# Patient Record
Sex: Male | Born: 1945 | Race: White | Hispanic: No | Marital: Married | Smoking: Former smoker
Health system: Southern US, Community
[De-identification: ages and names within clinical notes are randomized; demographics above are authoritative.]

## PROBLEM LIST (undated history)

## (undated) DIAGNOSIS — I10 Essential (primary) hypertension: Secondary | ICD-10-CM

## (undated) DIAGNOSIS — N289 Disorder of kidney and ureter, unspecified: Secondary | ICD-10-CM

## (undated) HISTORY — PX: EYE SURGERY: SHX253

## (undated) HISTORY — PX: BLADDER REMOVAL: SHX567

## (undated) HISTORY — PX: KIDNEY STONE SURGERY: SHX686

---

## 2011-01-10 ENCOUNTER — Encounter: Payer: Self-pay | Admitting: *Deleted

## 2011-01-10 ENCOUNTER — Emergency Department (HOSPITAL_COMMUNITY)
Admission: EM | Admit: 2011-01-10 | Discharge: 2011-01-10 | Disposition: A | Discharge status: Home / Self Care | Payer: Medicare Other | Attending: Emergency Medicine | Admitting: Emergency Medicine

## 2011-01-10 DIAGNOSIS — E875 Hyperkalemia: Secondary | ICD-10-CM

## 2011-01-10 DIAGNOSIS — Z79899 Other long term (current) drug therapy: Secondary | ICD-10-CM | POA: Insufficient documentation

## 2011-01-10 DIAGNOSIS — N289 Disorder of kidney and ureter, unspecified: Secondary | ICD-10-CM

## 2011-01-10 DIAGNOSIS — Z87891 Personal history of nicotine dependence: Secondary | ICD-10-CM | POA: Insufficient documentation

## 2011-01-10 HISTORY — DX: Disorder of kidney and ureter, unspecified: N28.9

## 2011-01-10 HISTORY — DX: Essential (primary) hypertension: I10

## 2011-01-10 LAB — BASIC METABOLIC PANEL
Chloride: 107 mEq/L (ref 96–112)
GFR calc non Af Amer: 23 mL/min — ABNORMAL LOW (ref >90)
Glucose, Bld: 93 mg/dL (ref 70–99)
Potassium: 5.3 mEq/L — ABNORMAL HIGH (ref 3.5–5.1)
Sodium: 135 mEq/L (ref 135–145)

## 2011-01-10 LAB — CBC
Hemoglobin: 12.9 g/dL — ABNORMAL LOW (ref 13.0–17.0)
MCH: 28.8 pg (ref 26.0–34.0)
RBC: 4.48 MIL/uL (ref 4.22–5.81)

## 2011-01-10 MED ORDER — SODIUM POLYSTYRENE SULFONATE 15 GM/60ML PO SUSP
30.0000 g | Freq: Once | ORAL | Status: AC
  Administered 2011-01-10: 30 g via ORAL
  Filled 2011-01-10: qty 120

## 2011-01-10 NOTE — ED Notes (Signed)
Pt states he had his blood drawn yesterday and was called today at 0630 and told his K+ was high and he needed to get to the ED.

## 2011-01-10 NOTE — ED Provider Notes (Signed)
History     CSN: [id number] Arrival date & time: 01/10/2011 10:10 AM  Chief Complaint  Patient presents with  . Illegal value: [    abnormal labs    (Consider location/radiation/quality/duration/timing/severity/associated sxs/prior treatment) The history is provided by the patient.  patient states has hx renal insufficiency, had routine labs done yesterday and was told k high. Pt denies any specific c/o. No weakness. No sob. No nv. States only recent new med was lisinopril. Was told of K being high just today. No exacerbating or allev factors.   Past Medical History  Diagnosis Date  . Renal disorder   . Hypertension     Past Surgical History  Procedure Date  . Bladder removal   . Eye surgery   . Kidney stone surgery     History reviewed. No pertinent family history.  History  Substance Use Topics  . Smoking status: Former Research scientist (life sciences)  . Smokeless tobacco: Not on file  . Alcohol Use: Yes     occasionally      Review of Systems  Constitutional: Negative for fever.  HENT: Negative for neck pain.   Eyes: Negative for pain.  Respiratory: Negative for shortness of breath.   Cardiovascular: Negative for chest pain.  Gastrointestinal: Negative for abdominal pain.  Musculoskeletal: Negative for back pain.  Skin: Negative for rash.  Neurological: Negative for headaches.  Hematological: Does not bruise/bleed easily.  Psychiatric/Behavioral: Negative for behavioral problems.    Allergies  Doxycycline; Penicillins; and Metronidazole  Home Medications   Current Outpatient Rx  Name Route Sig Dispense Refill  . DOCUSATE SODIUM 100 MG PO CAPS Oral Take 100 mg by mouth 2 (two) times daily.      Marland Kitchen HYDROCODONE-ACETAMINOPHEN 5-500 MG PO CAPS Oral Take 1 capsule by mouth every 6 (six) hours as needed. For pain     . OXYCODONE HCL 5 MG PO CAPS Oral Take 5 mg by mouth every 4 (four) hours as needed. For pain     . POLYETHYLENE GLYCOL 3350 PO PACK Oral Take 17 g by mouth daily as  needed. constipation      . SORBITOL 70 % PO SOLN Oral Take 15 mLs by mouth daily as needed. constipation     . SULFAMETHOXAZOLE-TRIMETHOPRIM 400-80 MG PO TABS Oral Take 1 tablet by mouth daily.        BP 152/82  Pulse 74  Temp(Src) 97.6 F (36.4 C) (Oral)  Resp 16  Ht 5\' 9"  (1.753 m)  Wt 185 lb (83.915 kg)  BMI 27.32 kg/m2  SpO2 100%  Physical Exam  Nursing note and vitals reviewed. Constitutional: He is oriented to person, place, and time. He appears well-developed and well-nourished. No distress.  HENT:  Head: Atraumatic.  Eyes: No scleral icterus.  Neck: Neck supple. No tracheal deviation present.  Cardiovascular: Normal rate and normal heart sounds.   Pulmonary/Chest: Effort normal and breath sounds normal. No accessory muscle usage. No respiratory distress.  Abdominal: He exhibits no distension. There is no tenderness.  Musculoskeletal: Normal range of motion. He exhibits no edema.  Neurological: He is alert and oriented to person, place, and time.  Skin: Skin is warm and dry.  Psychiatric: He has a normal mood and affect.    ED Course  Procedures (including critical care time)  Labs Reviewed  BASIC METABOLIC PANEL - Abnormal; Notable for the following:    Potassium 5.3 (*)    CO2 17 (*)    BUN 37 (*)    Creatinine, Ser 2.72 (*)  GFR calc non Af Amer 23 (*)    GFR calc Af Amer 27 (*)    All other components within normal limits  CBC - Abnormal; Notable for the following:    Hemoglobin 12.9 (*)    RDW 16.0 (*)    All other components within normal limits   No results found.   1. Renal insufficiency   2. Hyperkalemia       MDM  Labs sent.     k sl high. Kayexalate po. Will have f/u pcp 2 days for recheck, and repeat k, will have hold lisinopril until pcp f/u.     Mirna Mires, MD 01/10/11 1136

## 2012-05-06 DIAGNOSIS — N2581 Secondary hyperparathyroidism of renal origin: Secondary | ICD-10-CM | POA: Insufficient documentation

## 2015-07-01 ENCOUNTER — Other Ambulatory Visit (HOSPITAL_COMMUNITY): Payer: Self-pay | Admitting: Family Medicine

## 2015-07-01 ENCOUNTER — Ambulatory Visit (HOSPITAL_COMMUNITY)
Admission: RE | Admit: 2015-07-01 | Discharge: 2015-07-01 | Disposition: A | Discharge status: Home / Self Care | Payer: Medicare (Managed Care) | Source: Ambulatory Visit | Attending: Family Medicine | Admitting: Family Medicine

## 2015-07-01 DIAGNOSIS — Z87891 Personal history of nicotine dependence: Secondary | ICD-10-CM | POA: Diagnosis not present

## 2015-07-01 DIAGNOSIS — R0602 Shortness of breath: Secondary | ICD-10-CM

## 2015-07-01 DIAGNOSIS — J189 Pneumonia, unspecified organism: Secondary | ICD-10-CM | POA: Insufficient documentation

## 2015-08-05 ENCOUNTER — Ambulatory Visit (HOSPITAL_COMMUNITY)
Admission: RE | Admit: 2015-08-05 | Discharge: 2015-08-05 | Disposition: A | Discharge status: Home / Self Care | Payer: Medicare Other | Source: Ambulatory Visit | Attending: Family Medicine | Admitting: Family Medicine

## 2015-08-05 ENCOUNTER — Other Ambulatory Visit (HOSPITAL_COMMUNITY): Payer: Self-pay | Admitting: Family Medicine

## 2015-08-05 DIAGNOSIS — J189 Pneumonia, unspecified organism: Secondary | ICD-10-CM | POA: Insufficient documentation

## 2015-08-05 DIAGNOSIS — R918 Other nonspecific abnormal finding of lung field: Secondary | ICD-10-CM

## 2015-11-23 ENCOUNTER — Other Ambulatory Visit (HOSPITAL_COMMUNITY): Payer: Self-pay | Admitting: Family Medicine

## 2015-11-23 ENCOUNTER — Ambulatory Visit (HOSPITAL_COMMUNITY)
Admission: RE | Admit: 2015-11-23 | Discharge: 2015-11-23 | Disposition: A | Discharge status: Home / Self Care | Payer: Medicare Other | Source: Ambulatory Visit | Attending: Family Medicine | Admitting: Family Medicine

## 2015-11-23 DIAGNOSIS — J189 Pneumonia, unspecified organism: Secondary | ICD-10-CM

## 2015-11-23 DIAGNOSIS — J181 Lobar pneumonia, unspecified organism: Secondary | ICD-10-CM

## 2015-11-23 DIAGNOSIS — Z8701 Personal history of pneumonia (recurrent): Secondary | ICD-10-CM | POA: Insufficient documentation

## 2015-11-23 DIAGNOSIS — R918 Other nonspecific abnormal finding of lung field: Secondary | ICD-10-CM | POA: Insufficient documentation

## 2015-11-23 DIAGNOSIS — Z09 Encounter for follow-up examination after completed treatment for conditions other than malignant neoplasm: Secondary | ICD-10-CM | POA: Insufficient documentation

## 2016-12-05 ENCOUNTER — Other Ambulatory Visit (HOSPITAL_COMMUNITY): Payer: Self-pay | Admitting: Family Medicine

## 2016-12-05 ENCOUNTER — Ambulatory Visit (HOSPITAL_COMMUNITY)
Admission: RE | Admit: 2016-12-05 | Discharge: 2016-12-05 | Disposition: A | Discharge status: Home / Self Care | Payer: Medicare Other | Source: Ambulatory Visit | Attending: Family Medicine | Admitting: Family Medicine

## 2016-12-05 DIAGNOSIS — J984 Other disorders of lung: Secondary | ICD-10-CM | POA: Insufficient documentation

## 2016-12-05 DIAGNOSIS — R0602 Shortness of breath: Secondary | ICD-10-CM

## 2016-12-05 DIAGNOSIS — I700 Atherosclerosis of aorta: Secondary | ICD-10-CM | POA: Diagnosis not present

## 2016-12-05 DIAGNOSIS — Z87828 Personal history of other (healed) physical injury and trauma: Secondary | ICD-10-CM | POA: Diagnosis not present

## 2016-12-08 ENCOUNTER — Other Ambulatory Visit (HOSPITAL_COMMUNITY): Payer: Self-pay | Admitting: Family Medicine

## 2016-12-08 ENCOUNTER — Ambulatory Visit (HOSPITAL_COMMUNITY)
Admission: RE | Admit: 2016-12-08 | Discharge: 2016-12-08 | Disposition: A | Discharge status: Home / Self Care | Payer: Medicare Other | Source: Ambulatory Visit | Attending: Family Medicine | Admitting: Family Medicine

## 2016-12-08 ENCOUNTER — Encounter (HOSPITAL_COMMUNITY)
Admission: RE | Admit: 2016-12-08 | Discharge: 2016-12-08 | Disposition: A | Discharge status: Home / Self Care | Payer: Medicare Other | Source: Ambulatory Visit | Attending: Family Medicine | Admitting: Family Medicine

## 2016-12-08 ENCOUNTER — Encounter (HOSPITAL_COMMUNITY): Payer: Self-pay

## 2016-12-08 DIAGNOSIS — J9811 Atelectasis: Secondary | ICD-10-CM | POA: Diagnosis not present

## 2016-12-08 DIAGNOSIS — R0602 Shortness of breath: Secondary | ICD-10-CM

## 2016-12-08 DIAGNOSIS — R918 Other nonspecific abnormal finding of lung field: Secondary | ICD-10-CM | POA: Insufficient documentation

## 2016-12-08 MED ORDER — TECHNETIUM TC 99M DIETHYLENETRIAME-PENTAACETIC ACID
36.0000 | Freq: Once | INTRAVENOUS | Status: AC | PRN
  Administered 2016-12-08: 36 via INTRAVENOUS

## 2016-12-08 MED ORDER — TECHNETIUM TO 99M ALBUMIN AGGREGATED
3.0000 | Freq: Once | INTRAVENOUS | Status: AC | PRN
  Administered 2016-12-08: 4.6 via INTRAVENOUS

## 2017-03-06 DIAGNOSIS — C679 Malignant neoplasm of bladder, unspecified: Secondary | ICD-10-CM | POA: Insufficient documentation

## 2017-03-06 DIAGNOSIS — K219 Gastro-esophageal reflux disease without esophagitis: Secondary | ICD-10-CM | POA: Insufficient documentation

## 2017-03-06 DIAGNOSIS — I10 Essential (primary) hypertension: Secondary | ICD-10-CM | POA: Insufficient documentation

## 2017-03-06 DIAGNOSIS — N186 End stage renal disease: Secondary | ICD-10-CM | POA: Insufficient documentation

## 2017-05-07 ENCOUNTER — Other Ambulatory Visit (HOSPITAL_COMMUNITY): Payer: Self-pay | Admitting: Nephrology

## 2017-05-07 DIAGNOSIS — N184 Chronic kidney disease, stage 4 (severe): Secondary | ICD-10-CM

## 2017-05-22 ENCOUNTER — Ambulatory Visit (HOSPITAL_COMMUNITY)
Admission: RE | Admit: 2017-05-22 | Discharge: 2017-05-22 | Disposition: A | Discharge status: Home / Self Care | Payer: Medicare Other | Source: Ambulatory Visit | Attending: Nephrology | Admitting: Nephrology

## 2017-05-22 DIAGNOSIS — N184 Chronic kidney disease, stage 4 (severe): Secondary | ICD-10-CM | POA: Diagnosis not present

## 2017-10-03 ENCOUNTER — Emergency Department (HOSPITAL_COMMUNITY): Payer: Medicare Other

## 2017-10-03 ENCOUNTER — Emergency Department (HOSPITAL_COMMUNITY)
Admission: EM | Admit: 2017-10-03 | Discharge: 2017-10-03 | Disposition: A | Discharge status: Home / Self Care | Payer: Medicare Other | Attending: Emergency Medicine | Admitting: Emergency Medicine

## 2017-10-03 ENCOUNTER — Encounter (HOSPITAL_COMMUNITY): Payer: Self-pay

## 2017-10-03 DIAGNOSIS — R531 Weakness: Secondary | ICD-10-CM | POA: Insufficient documentation

## 2017-10-03 DIAGNOSIS — E860 Dehydration: Secondary | ICD-10-CM | POA: Insufficient documentation

## 2017-10-03 DIAGNOSIS — Z8551 Personal history of malignant neoplasm of bladder: Secondary | ICD-10-CM | POA: Diagnosis not present

## 2017-10-03 DIAGNOSIS — Z79899 Other long term (current) drug therapy: Secondary | ICD-10-CM | POA: Insufficient documentation

## 2017-10-03 DIAGNOSIS — Z87891 Personal history of nicotine dependence: Secondary | ICD-10-CM | POA: Diagnosis not present

## 2017-10-03 DIAGNOSIS — N390 Urinary tract infection, site not specified: Secondary | ICD-10-CM | POA: Diagnosis not present

## 2017-10-03 DIAGNOSIS — Z932 Ileostomy status: Secondary | ICD-10-CM | POA: Diagnosis not present

## 2017-10-03 DIAGNOSIS — R5383 Other fatigue: Secondary | ICD-10-CM | POA: Diagnosis not present

## 2017-10-03 DIAGNOSIS — I120 Hypertensive chronic kidney disease with stage 5 chronic kidney disease or end stage renal disease: Secondary | ICD-10-CM | POA: Insufficient documentation

## 2017-10-03 DIAGNOSIS — N185 Chronic kidney disease, stage 5: Secondary | ICD-10-CM | POA: Insufficient documentation

## 2017-10-03 LAB — COMPREHENSIVE METABOLIC PANEL
ALK PHOS: 94 U/L (ref 38–126)
ALT: 24 U/L (ref 0–44)
ANION GAP: 8 (ref 5–15)
AST: 20 U/L (ref 15–41)
Albumin: 3.8 g/dL (ref 3.5–5.0)
BUN: 71 mg/dL — ABNORMAL HIGH (ref 8–23)
CALCIUM: 9.1 mg/dL (ref 8.9–10.3)
CO2: 14 mmol/L — AB (ref 22–32)
Chloride: 116 mmol/L — ABNORMAL HIGH (ref 98–111)
Creatinine, Ser: 4.14 mg/dL — ABNORMAL HIGH (ref 0.61–1.24)
GFR calc non Af Amer: 13 mL/min — ABNORMAL LOW (ref >60)
GFR, EST AFRICAN AMERICAN: 15 mL/min — AB (ref >60)
Glucose, Bld: 115 mg/dL — ABNORMAL HIGH (ref 70–99)
Potassium: 4 mmol/L (ref 3.5–5.1)
SODIUM: 138 mmol/L (ref 135–145)
Total Bilirubin: 0.8 mg/dL (ref 0.3–1.2)
Total Protein: 8.1 g/dL (ref 6.5–8.1)

## 2017-10-03 LAB — CBC WITH DIFFERENTIAL/PLATELET
BASOS ABS: 0 10*3/uL (ref 0.0–0.1)
BASOS PCT: 0 %
Eosinophils Absolute: 0.1 10*3/uL (ref 0.0–0.7)
Eosinophils Relative: 1 %
HEMATOCRIT: 52.3 % — AB (ref 39.0–52.0)
HEMOGLOBIN: 17.5 g/dL — AB (ref 13.0–17.0)
Lymphocytes Relative: 11 %
Lymphs Abs: 1 10*3/uL (ref 0.7–4.0)
MCH: 30.3 pg (ref 26.0–34.0)
MCHC: 33.5 g/dL (ref 30.0–36.0)
MCV: 90.5 fL (ref 78.0–100.0)
MONOS PCT: 10 %
Monocytes Absolute: 0.9 10*3/uL (ref 0.1–1.0)
NEUTROS ABS: 6.8 10*3/uL (ref 1.7–7.7)
NEUTROS PCT: 78 %
Platelets: 187 10*3/uL (ref 150–400)
RBC: 5.78 MIL/uL (ref 4.22–5.81)
RDW: 15.5 % (ref 11.5–15.5)
WBC: 8.8 10*3/uL (ref 4.0–10.5)

## 2017-10-03 LAB — URINALYSIS, ROUTINE W REFLEX MICROSCOPIC
BILIRUBIN URINE: NEGATIVE
Glucose, UA: NEGATIVE mg/dL
Ketones, ur: NEGATIVE mg/dL
NITRITE: NEGATIVE
PH: 6 (ref 5.0–8.0)
Protein, ur: 30 mg/dL — AB
SPECIFIC GRAVITY, URINE: 1.008 (ref 1.005–1.030)
WBC, UA: >50 WBC/hpf — ABNORMAL HIGH (ref 0–5)

## 2017-10-03 LAB — TROPONIN I

## 2017-10-03 LAB — I-STAT TROPONIN, ED: Troponin i, poc: 0 ng/mL (ref 0.00–0.08)

## 2017-10-03 MED ORDER — SODIUM CHLORIDE 0.9 % IV BOLUS
1000.0000 mL | Freq: Once | INTRAVENOUS | Status: AC
  Administered 2017-10-03: 1000 mL via INTRAVENOUS

## 2017-10-03 MED ORDER — CIPROFLOXACIN HCL 500 MG PO TABS: 500.0000 mg | ORAL_TABLET | Freq: Two times a day (BID) | ORAL | 0 refills | Status: DC

## 2017-10-03 MED ORDER — CIPROFLOXACIN HCL 250 MG PO TABS
500.0000 mg | ORAL_TABLET | Freq: Once | ORAL | Status: AC
  Administered 2017-10-03: 500 mg via ORAL
  Filled 2017-10-03: qty 2

## 2017-10-03 NOTE — ED Notes (Signed)
Repeat EKG done at 10:52

## 2017-10-03 NOTE — ED Triage Notes (Signed)
Pt states change of diabetic medication 2 weeks ago, has not been compliant with prescribed medication. States generalized weakness, emesis, and constipation.

## 2017-10-03 NOTE — ED Notes (Signed)
Advised patient we needed urine specimen. 

## 2017-10-03 NOTE — ED Provider Notes (Signed)
[facility] EMERGENCY DEPARTMENT Provider Note   CSN: [id number] Arrival date & time: 10/03/17  1015     History   Chief Complaint Chief Complaint  Patient presents with  . Weakness    HPI Shane Reeves is a 72 y.o. male.  Pt presents to the ED today with weakness.  The pt is very vague.  He said he feels terrible.  His wife said he has not been eating or drinking much.  The pt does have a hx of dm and was rx'd a different insulin, but that made him feel worse, so he quit taking it.  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  Pt gets frequent ureter infections.  He feels like he has one again.  He has seen pus come out into his bag. 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  He denies f/c.     Past Medical History:  Diagnosis Date  . Hypertension   . Renal disorder     There are no active problems to display for this patient.   Past Surgical History:  Procedure Laterality Date  . BLADDER REMOVAL    . EYE SURGERY    . KIDNEY STONE SURGERY          Home Medications    Prior to Admission medications   Medication Sig Start Date End Date Taking? Authorizing Provider  albuterol (ACCUNEB) 1.25 MG/3ML nebulizer solution Take 1 ampule by nebulization every 6 (six) hours as needed for wheezing.   Yes [provider]  albuterol (PROVENTIL HFA;VENTOLIN HFA) 108 (90 Base) MCG/ACT inhaler Inhale 1-2 puffs into the lungs every 6 (six) hours as needed for wheezing or shortness of breath.   Yes [provider]  BYDUREON BCISE 2 MG/0.85ML AUIJ Inject 2 mg into the skin once a week. 09/10/17  Yes [provider]  Cholecalciferol (VITAMIN D3) 1000 units CAPS Take 1 capsule by mouth daily.   Yes [provider]  docusate sodium (COLACE) 100 MG capsule Take 100 mg by mouth 2 (two) times daily.     Yes [provider]  ferrous sulfate 325 (65 FE) MG tablet Take 1  tablet by mouth 2 (two) times daily.   Yes [provider]  furosemide (LASIX) 20 MG tablet Take 20 mg by mouth daily.  08/13/17  Yes [provider]  hydrocortisone (ANUSOL-HC) 25 MG suppository Place 1 suppository rectally 3 (three) times daily as needed. 09/08/17  Yes [provider]  mometasone (NASONEX) 50 MCG/ACT nasal spray Place 2 sprays into the nose daily.   Yes [provider]  naproxen (NAPROSYN) 500 MG tablet Take 500 mg by mouth 2 (two) times daily. 09/04/17  Yes [provider]  omeprazole (PRILOSEC) 20 MG capsule Take 20 mg by mouth daily. 06/28/17  Yes [provider]  oxycodone (OXY-IR) 5 MG capsule Take 5 mg by mouth every 4 (four) hours as needed. For pain    Yes [provider]  polyethylene glycol (MIRALAX / GLYCOLAX) packet Take 17 g by mouth daily as needed. constipation     Yes [provider]  pravastatin (PRAVACHOL) 40 MG tablet Take 40 mg by mouth daily. 07/19/17  Yes [provider]  propranolol (INDERAL) 40 MG tablet Take 40 mg by mouth daily. 06/28/17  Yes [provider]  sodium bicarbonate 325 MG tablet Take 1 tablet by mouth 2 (  two) times daily.   Yes [provider]  sorbitol 70 % solution Take 15 mLs by mouth daily as needed. constipation    Yes [provider]  ciprofloxacin (CIPRO) 500 MG tablet Take 1 tablet (500 mg total) by mouth 2 (two) times daily. 10/03/17   Isla Pence, MD  JARDIANCE 25 MG TABS tablet Take 25 mg by mouth daily. 08/28/17   [provider]    Family History History reviewed. No pertinent family history.  Social History Social History   Tobacco Use  . Smoking status: Former Smoker  Substance Use Topics  . Alcohol use: Yes    Comment: occasionally  . Drug use: No     Allergies   Doxycycline; Penicillins; and Metronidazole   Review of Systems Review of Systems  Constitutional: Positive for appetite change and  fatigue.  Neurological: Positive for weakness.  All other systems reviewed and are negative.    Physical Exam Updated Vital Signs BP 115/72   Pulse 65   Temp 98.2 F (36.8 C) (Oral)   Resp 16   Ht 5\' 11"  (1.803 m)   Wt 79.4 kg (175 lb)   SpO2 99%   BMI 24.41 kg/m   Physical Exam  Constitutional: He is oriented to person, place, and time. He appears well-developed and well-nourished.  HENT:  Head: Normocephalic and atraumatic.  Right Ear: External ear normal.  Left Ear: External ear normal.  Nose: Nose normal.  Mouth/Throat: Oropharynx is clear and moist.  Eyes: Pupils are equal, round, and reactive to light. Conjunctivae and EOM are normal.  Neck: Normal range of motion. Neck supple.  Cardiovascular: Normal rate, regular rhythm, normal heart sounds and intact distal pulses.  Pulmonary/Chest: Effort normal and breath sounds normal.  Abdominal: Soft. Bowel sounds are normal.  Ileostomy noted  Musculoskeletal: Normal range of motion.  Neurological: He is alert and oriented to person, place, and time.  Skin: Skin is warm. Capillary refill takes less than 2 seconds.  Psychiatric: He has a normal mood and affect. His behavior is normal. Judgment and thought content normal.  Nursing note and vitals reviewed.    ED Treatments / Results  Labs (all labs ordered are listed, but only abnormal results are displayed) Labs Reviewed  COMPREHENSIVE METABOLIC PANEL - Abnormal; Notable for the following components:      Result Value   Chloride 116 (*)    CO2 14 (*)    Glucose, Bld 115 (*)    BUN 71 (*)    Creatinine, Ser 4.14 (*)    GFR calc non Af Amer 13 (*)    GFR calc Af Amer 15 (*)    All other components within normal limits  CBC WITH DIFFERENTIAL/PLATELET - Abnormal; Notable for the following components:   Hemoglobin 17.5 (*)    HCT 52.3 (*)    All other components within normal limits  URINALYSIS, ROUTINE W REFLEX MICROSCOPIC - Abnormal; Notable for the following  components:   APPearance CLOUDY (*)    Hgb urine dipstick SMALL (*)    Protein, ur 30 (*)    Leukocytes, UA LARGE (*)    WBC, UA >50 (*)    Bacteria, UA MANY (*)    All other components within normal limits  URINE CULTURE  TROPONIN I  I-STAT TROPONIN, ED    EKG EKG Interpretation  Date/Time:  Wednesday October 03 2017 10:52:29 EDT Ventricular Rate:  61 PR Interval:    QRS Duration: 171 QT Interval:  438 QTC  Calculation: 442 R Axis:   37 Text Interpretation:  Sinus rhythm Right bundle branch block Inferior infarct, old Lateral leads are also involved No old tracing to compare Confirmed by Isla Pence [phone]) on 10/03/2017 11:11:07 AM   Radiology Ct Abdomen Pelvis Wo Contrast  Result Date: 10/03/2017 CLINICAL DATA:  Generalized weakness, emesis, and constipation for the past few days. History of prior cystectomy for bladder cancer. EXAM: CT ABDOMEN AND PELVIS WITHOUT CONTRAST TECHNIQUE: Multidetector CT imaging of the abdomen and pelvis was performed following the standard protocol without IV contrast. COMPARISON:  Renal ultrasound dated May 22, 2017. FINDINGS: Lower chest: No acute abnormality. 7 x 7 mm sub solid nodule in the left lower lobe (series 4, image 5). Hepatobiliary: No focal liver abnormality. Small gallstones. No gallbladder wall thickening or biliary dilatation. Pancreas: Unremarkable. No pancreatic ductal dilatation or surrounding inflammatory changes. Spleen: Normal in size without focal abnormality. Adrenals/Urinary Tract: The adrenal glands are unremarkable. Mild-to-moderate bilateral renal cortical thinning. Subcentimeter low-density lesions in both kidneys are too small to characterize. Small bilateral nonobstructive renal calculi. No hydronephrosis. Postsurgical changes related to prior cystectomy and right lower quadrant ileal conduit creation. Stomach/Bowel: The stomach is within normal limits. No bowel wall thickening, distention, or surrounding inflammatory  changes. Moderate amount of stool throughout the colon. Nondilated appendix with multiple appendicoliths at the tip. Vascular/Lymphatic: Aortic atherosclerosis. No enlarged abdominal or pelvic lymph nodes. Prior bilateral pelvic lymph node dissections. Reproductive: Prostate is unremarkable. 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 Additional lower anterior abdominal wall left paramidline hernia containing nondilated small bowel. Tiny fat containing paraumbilical hernia. Small left-greater-than-right bilateral inguinal hernias. No free fluid or pneumoperitoneum. Musculoskeletal: No acute or significant osseous findings. Degenerative changes of the lumbar spine. IMPRESSION: 1.  No acute intra-abdominal process. 2.  Prominent stool throughout the colon favors constipation. 3. Postsurgical changes related to prior cystectomy and right lower quadrant ileal conduit. 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 4. Additional lower anterior abdominal wall left paramidline hernia containing nondilated small bowel. 5. 7 mm sub solid nodule in the left lower lobe. Initial follow-up with CT at 6-12 months is recommended to confirm persistence. If persistent, repeat CT is recommended every 2 years until 5 years of stability has been established. This recommendation follows the consensus statement: Guidelines for Management of Incidental Pulmonary Nodules Detected on CT Images: From the Fleischner Society 2017; Radiology 2017; 284:228-243. 6. Cholelithiasis. 7. Bilateral nonobstructive nephrolithiasis. 8.  Aortic atherosclerosis (ICD10-I70.0). Electronically Signed   By: Titus Dubin M.D.   On: 10/03/2017 13:24   Dg Chest 2 View  Result Date: 10/03/2017 CLINICAL DATA:  Weakness EXAM: CHEST - 2 VIEW COMPARISON:  12/08/2016 FINDINGS: The heart size and mediastinal contours are within normal limits. Both lungs are clear. The visualized skeletal structures  are unremarkable. IMPRESSION: No active cardiopulmonary disease. Electronically Signed   By: Inez Catalina M.D.   On: 10/03/2017 11:56    Procedures Procedures (including critical care time)  Medications Ordered in ED Medications  ciprofloxacin (CIPRO) tablet 500 mg (has no administration in time range)  sodium chloride 0.9 % bolus 1,000 mL (0 mLs Intravenous Stopped 10/03/17 1242)     Initial Impression / Assessment and Plan / ED Course  I have reviewed the triage vital signs and the nursing notes.  Pertinent labs & imaging results that were available during my care of the patient were reviewed by me and considered in my medical decision making (see chart  for details).    Pt is feeling better after IVFs.  The stage 5 ckd is old.  He is followed by Dr. Marlowe Sax (nephrology) in [place].  He has an appt with him on July 8.  Labs were printed out for pt to bring to Dr. Jannifer Franklin office.  The pt will be treated with cipro.  BS is nl, so I will keep him off the new insulin until he can see his pcp. He is instructed to return if worse.  Final Clinical Impressions(s) / ED Diagnoses   Final diagnoses:  Weakness  Dehydration  CKD (chronic kidney disease) stage 5, GFR less than 15 ml/min (HCC)  Urinary tract infection without hematuria, site unspecified    ED Discharge Orders        Ordered    ciprofloxacin (CIPRO) 500 MG tablet  2 times daily     10/03/17 1455       Isla Pence, MD 10/03/17 1503

## 2017-10-06 LAB — URINE CULTURE: Culture: >=100000 — AB

## 2017-10-07 ENCOUNTER — Telehealth: Payer: Self-pay

## 2017-10-07 NOTE — Telephone Encounter (Signed)
Post ED Visit - Positive Culture Follow-up  Culture report reviewed by antimicrobial stewardship pharmacist:  []  Elenor Quinones, Pharm.D. []  Heide Guile, Pharm.D., BCPS AQ-ID [x]  Parks Neptune, Pharm.D., BCPS []  Alycia Rossetti, Pharm.D., BCPS []  [place], Pharm.D., BCPS, AAHIVP []  Legrand Como, Pharm.D., BCPS, AAHIVP []  Salome Arnt, PharmD, BCPS []  Johnnette Gourd, PharmD, BCPS []  Hughes Better, PharmD, BCPS []  Leeroy Cha, PharmD  Positive urine culture Treated with Ciprofloxcin, organism sensitive to the same and no further patient follow-up is required at this time.  Genia Del 10/07/2017, 9:30 AM

## 2019-11-06 ENCOUNTER — Ambulatory Visit (HOSPITAL_COMMUNITY)
Admission: RE | Admit: 2019-11-06 | Discharge: 2019-11-06 | Disposition: A | Discharge status: Home / Self Care | Payer: Medicare Other | Source: Ambulatory Visit | Attending: Family Medicine | Admitting: Family Medicine

## 2019-11-06 ENCOUNTER — Other Ambulatory Visit (HOSPITAL_COMMUNITY): Payer: Self-pay | Admitting: Family Medicine

## 2019-11-06 ENCOUNTER — Other Ambulatory Visit: Payer: Self-pay

## 2019-11-06 DIAGNOSIS — Z936 Other artificial openings of urinary tract status: Secondary | ICD-10-CM | POA: Insufficient documentation

## 2019-11-06 DIAGNOSIS — K469 Unspecified abdominal hernia without obstruction or gangrene: Secondary | ICD-10-CM | POA: Insufficient documentation

## 2019-11-06 DIAGNOSIS — J449 Chronic obstructive pulmonary disease, unspecified: Secondary | ICD-10-CM | POA: Insufficient documentation

## 2019-11-06 DIAGNOSIS — Z87440 Personal history of urinary (tract) infections: Secondary | ICD-10-CM | POA: Insufficient documentation

## 2019-11-06 DIAGNOSIS — M25551 Pain in right hip: Secondary | ICD-10-CM

## 2019-11-06 DIAGNOSIS — Z932 Ileostomy status: Secondary | ICD-10-CM | POA: Insufficient documentation

## 2019-11-06 DIAGNOSIS — N209 Urinary calculus, unspecified: Secondary | ICD-10-CM | POA: Insufficient documentation

## 2019-11-06 DIAGNOSIS — G8929 Other chronic pain: Secondary | ICD-10-CM | POA: Insufficient documentation

## 2019-11-06 DIAGNOSIS — N2589 Other disorders resulting from impaired renal tubular function: Secondary | ICD-10-CM | POA: Insufficient documentation

## 2020-09-13 IMAGING — DX DG HIP (WITH OR WITHOUT PELVIS) 2-3V*R*
3 series · 3 of 3 positions shown · non-contrast
Comparison: None.

CLINICAL DATA: Right hip pain

EXAM:
DG HIP (WITH OR WITHOUT PELVIS) 2-3V RIGHT

[pelvis ap]
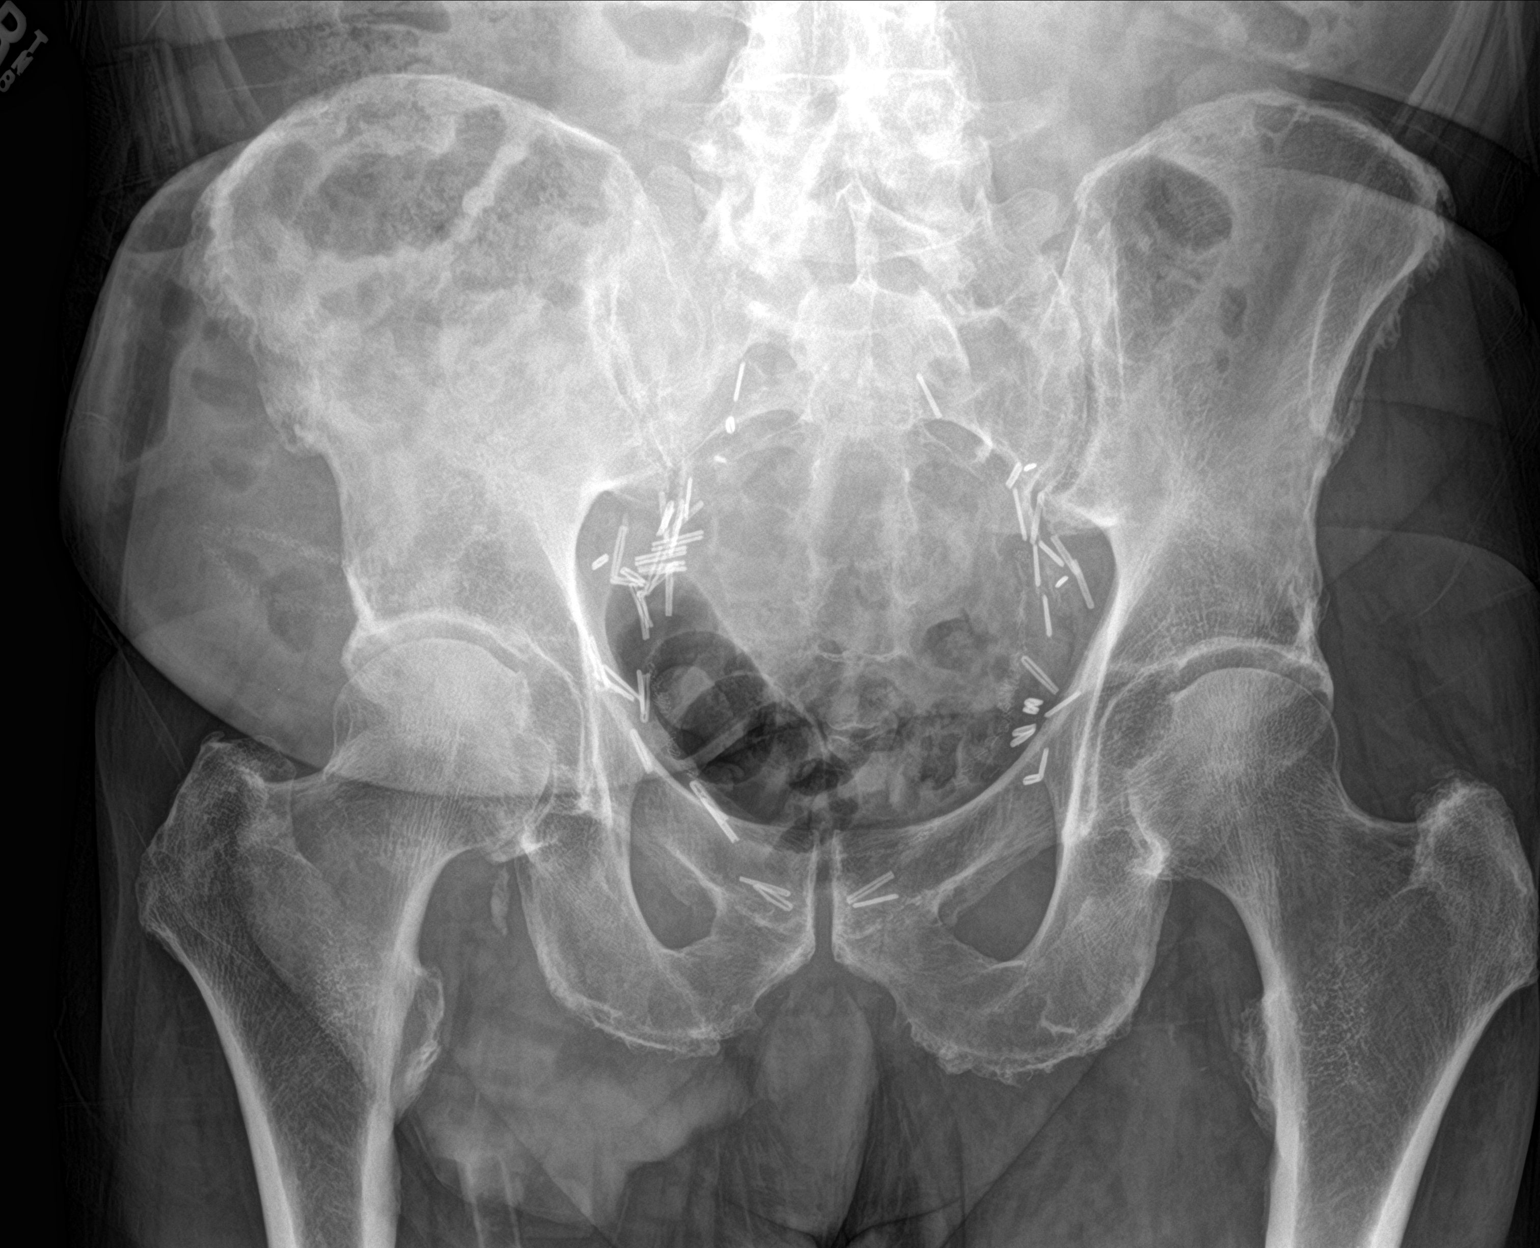

[hip ap]
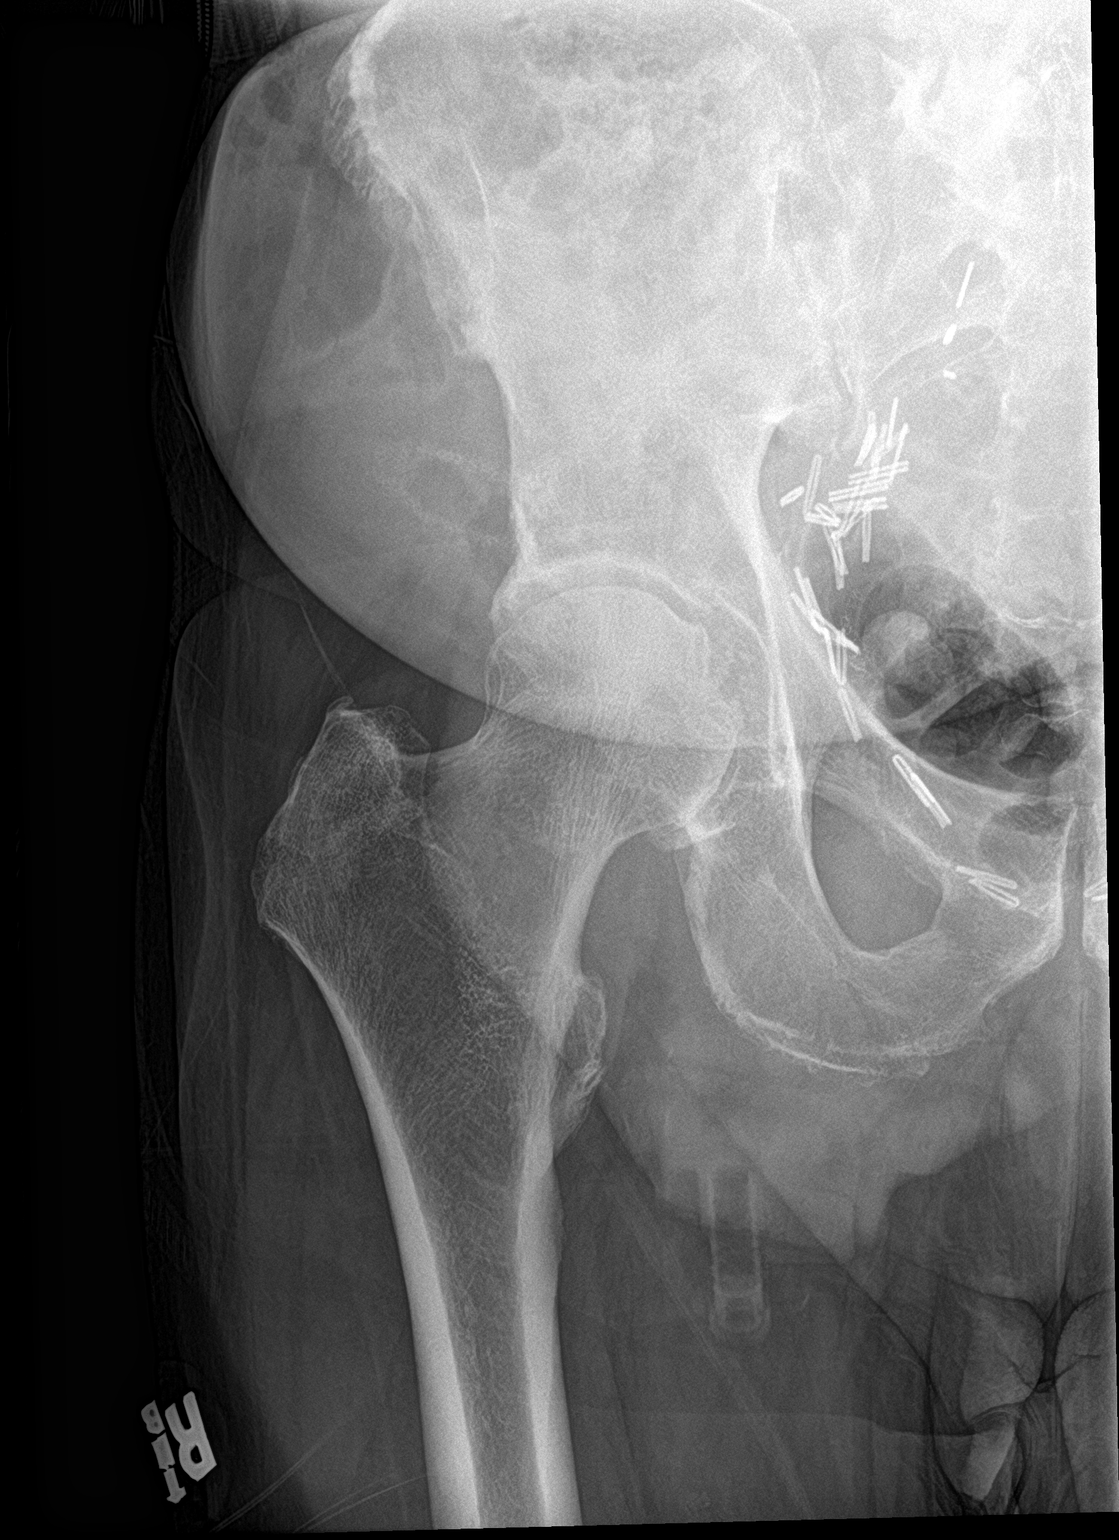

[hip frog leg]
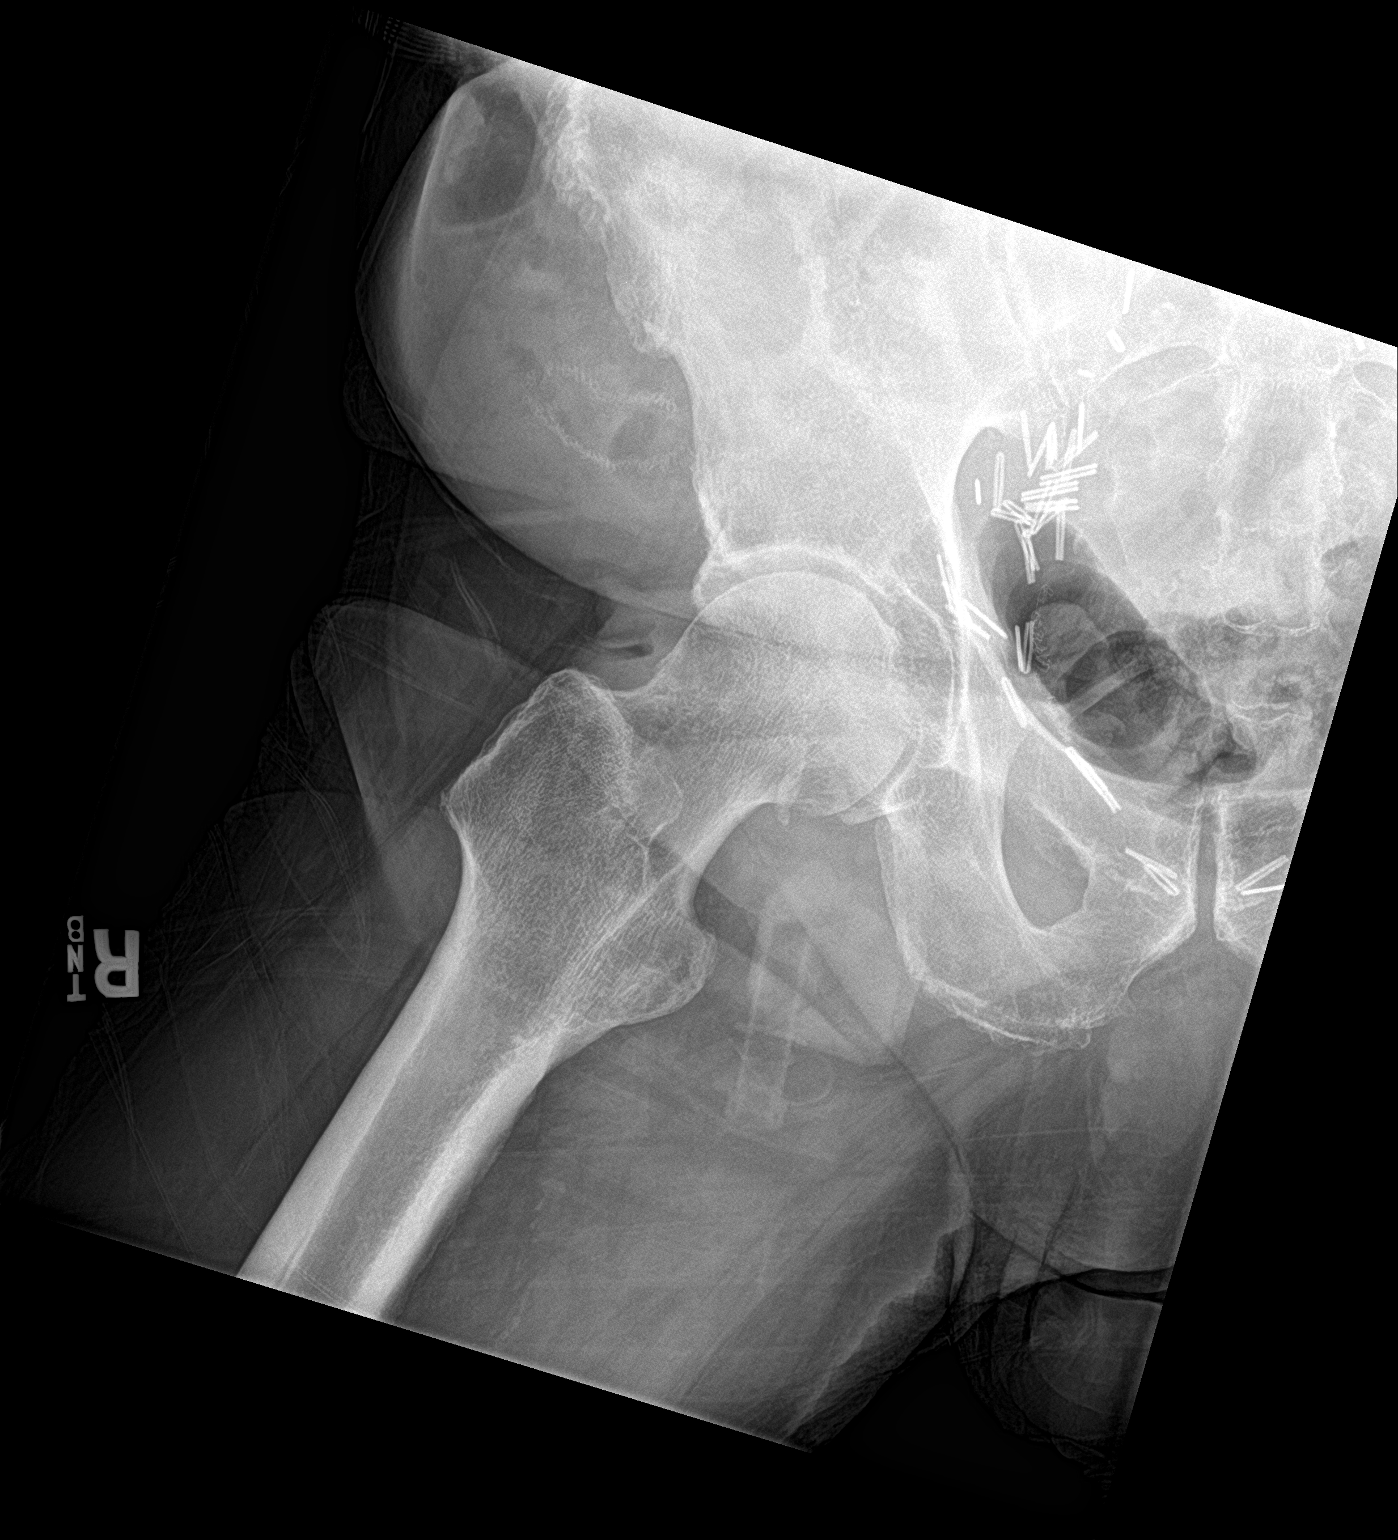

[3 of 3 positions shown; findings below may reference images not displayed]

FINDINGS: There are moderate degenerative changes of the right hip. There are
mild-to-moderate degenerative changes of the left hip. There is no
acute displaced fracture or dislocation. Multiple surgical clips
project over the patient's pelvis, likely related to prior lymph
node dissection.
IMPRESSION: Moderate right hip osteoarthritis.

## 2020-09-21 ENCOUNTER — Telehealth (INDEPENDENT_AMBULATORY_CARE_PROVIDER_SITE_OTHER): Payer: Self-pay

## 2020-09-21 ENCOUNTER — Other Ambulatory Visit (INDEPENDENT_AMBULATORY_CARE_PROVIDER_SITE_OTHER): Payer: Self-pay | Admitting: Internal Medicine

## 2020-09-21 NOTE — Telephone Encounter (Signed)
We cannot accept this patient- this patient takes oxycodone every single day.

## 2020-09-21 NOTE — Telephone Encounter (Signed)
Return call. Pt said "well he must not be a good dr if he does not give pain medications". Explained Dr Anastasio Champion is a wellness / BHRT/ internal medications practice. He does not accept patients on the narcotics. He is on the board for opioid abuse in the area.

## 2020-11-01 ENCOUNTER — Encounter: Payer: Self-pay | Admitting: Internal Medicine

## 2020-11-01 ENCOUNTER — Other Ambulatory Visit: Payer: Self-pay

## 2020-11-01 ENCOUNTER — Ambulatory Visit: Payer: Medicare Other | Admitting: Internal Medicine

## 2020-11-01 VITALS — BP 140/77 | HR 77 | Temp 98.4°F | Resp 18 | Ht 69.0 in | Wt 177.1 lb

## 2020-11-01 DIAGNOSIS — Z932 Ileostomy status: Secondary | ICD-10-CM

## 2020-11-01 DIAGNOSIS — K219 Gastro-esophageal reflux disease without esophagitis: Secondary | ICD-10-CM

## 2020-11-01 DIAGNOSIS — N186 End stage renal disease: Secondary | ICD-10-CM

## 2020-11-01 DIAGNOSIS — J449 Chronic obstructive pulmonary disease, unspecified: Secondary | ICD-10-CM | POA: Diagnosis not present

## 2020-11-01 DIAGNOSIS — G894 Chronic pain syndrome: Secondary | ICD-10-CM

## 2020-11-01 DIAGNOSIS — Z7689 Persons encountering health services in other specified circumstances: Secondary | ICD-10-CM | POA: Diagnosis not present

## 2020-11-01 DIAGNOSIS — Z131 Encounter for screening for diabetes mellitus: Secondary | ICD-10-CM | POA: Diagnosis not present

## 2020-11-01 DIAGNOSIS — J302 Other seasonal allergic rhinitis: Secondary | ICD-10-CM

## 2020-11-01 DIAGNOSIS — I10 Essential (primary) hypertension: Secondary | ICD-10-CM | POA: Diagnosis not present

## 2020-11-01 DIAGNOSIS — N184 Chronic kidney disease, stage 4 (severe): Secondary | ICD-10-CM

## 2020-11-01 LAB — POCT GLYCOSYLATED HEMOGLOBIN (HGB A1C)
HbA1c, POC (controlled diabetic range): 6 % (ref 0.0–7.0)
HbA1c, POC (prediabetic range): 6 % (ref 5.7–6.4)

## 2020-11-01 MED ORDER — MOMETASONE FUROATE 50 MCG/ACT NA SUSP: 2.0000 | Freq: Every day | NASAL | 5 refills | Status: DC

## 2020-11-01 MED ORDER — OXYCODONE HCL 5 MG PO CAPS: 5.0000 mg | ORAL_CAPSULE | Freq: Two times a day (BID) | ORAL | 0 refills | Status: DC

## 2020-11-01 NOTE — Assessment & Plan Note (Signed)
Has ileostomy in place, has h/o metastatic bladder cancer

## 2020-11-01 NOTE — Assessment & Plan Note (Signed)
Last BMP in EMR noted Follows up with Nephrology - has been advised for HD Wants second opinion, referred to Dr Theador Hawthorne On Vitamin D, iron supplements and sodium bicarbonate

## 2020-11-01 NOTE — Assessment & Plan Note (Signed)
Uses Albuterol inhaler PRN °Not on any maintenance treatment °

## 2020-11-01 NOTE — Assessment & Plan Note (Signed)
Takes Omeprazole.  

## 2020-11-01 NOTE — Patient Instructions (Signed)
Please continue taking medications as prescribed.  You are being referred to Nephrology for second opinion.  You are being referred to pain clinic for chronic pain management.

## 2020-11-01 NOTE — Progress Notes (Signed)
Established Patient Office Visit  Subjective:  Patient ID: Shane Reeves, male    DOB: 22-Sep-1945  Age: 75 y.o. MRN: [id number]  CC:  Chief Complaint  Patient presents with   New Patient (Initial Visit)    New patient was a dondiego pt was formerly on oxycodone for pain and he is out of this so irritated he has pain in back and kidneys also has ostomy for urine as he doesn't have bladder     HPI Shane Reeves is a 75 year old male with PMH of HTN, COPD, GERD, ESRD, malignant bladder cancer s/p resection and ileostomy placement and chronic pain syndrome who presents for establishing care.  HTN: BP is well-controlled. Takes medications regularly. Patient denies headache, dizziness, chest pain, dyspnea or palpitations.  ESRD: Has been following up with Nephrology. States that he denied HD and wants to know about treatment for it. I had discussion about limited treatment options for ESRD and it is not reversible in most of the cases. He asks for a second opinion from a different Nephrologist. He also has h/o frequent nephrolithiasis and UTIs in the past.  Ileostomy: Has ileostomy tube in place from surgery for bladder cancer according to him.  He has had 2 doses of COVID vaccine.  Past Medical History:  Diagnosis Date   Hypertension    Renal disorder     Past Surgical History:  Procedure Laterality Date   BLADDER REMOVAL     EYE SURGERY     KIDNEY STONE SURGERY      History reviewed. No pertinent family history.  Social History   Socioeconomic History   Marital status: Married    Spouse name: Not on file   Number of children: Not on file   Years of education: Not on file   Highest education level: Not on file  Occupational History   Not on file  Tobacco Use   Smoking status: Former    Passive exposure: Past   Smokeless tobacco: Never  Substance and Sexual Activity   Alcohol use: Yes    Comment: occasionally   Drug use: No   Sexual activity: Not on file   Other Topics Concern   Not on file  Social History Narrative   Not on file   Social Determinants of Health   Financial Resource Strain: Not on file  Food Insecurity: Not on file  Transportation Needs: Not on file  Physical Activity: Not on file  Stress: Not on file  Social Connections: Not on file  Intimate Partner Violence: Not on file    Outpatient Medications Prior to Visit  Medication Sig Dispense Refill   amLODipine (NORVASC) 10 MG tablet Take 10 mg by mouth daily.     Cholecalciferol (VITAMIN D3) 1000 units CAPS Take 1 capsule by mouth daily.     docusate sodium (COLACE) 100 MG capsule Take 100 mg by mouth 2 (two) times daily.       ferrous sulfate 325 (65 FE) MG tablet Take 1 tablet by mouth 2 (two) times daily.     omeprazole (PRILOSEC) 20 MG capsule Take 20 mg by mouth daily.  3   pravastatin (PRAVACHOL) 40 MG tablet Take 40 mg by mouth daily.  6   sodium bicarbonate 325 MG tablet Take 1 tablet by mouth 2 (two) times daily.     BYDUREON BCISE 2 MG/0.85ML AUIJ Inject 2 mg into the skin once a week.  2   mometasone (NASONEX) 50 MCG/ACT nasal spray Place 2  sprays into the nose daily.     oxycodone (OXY-IR) 5 MG capsule Take 5 mg by mouth every 4 (four) hours as needed. For pain      albuterol (ACCUNEB) 1.25 MG/3ML nebulizer solution Take 1 ampule by nebulization every 6 (six) hours as needed for wheezing. (Patient not taking: Reported on 11/01/2020)     albuterol (PROVENTIL HFA;VENTOLIN HFA) 108 (90 Base) MCG/ACT inhaler Inhale 1-2 puffs into the lungs every 6 (six) hours as needed for wheezing or shortness of breath. (Patient not taking: Reported on 11/01/2020)     ciprofloxacin (CIPRO) 500 MG tablet Take 1 tablet (500 mg total) by mouth 2 (two) times daily. (Patient not taking: Reported on 11/01/2020) 14 tablet 0   furosemide (LASIX) 20 MG tablet Take 20 mg by mouth daily.  (Patient not taking: Reported on 11/01/2020)  3   hydrocortisone (ANUSOL-HC) 25 MG suppository Place 1  suppository rectally 3 (three) times daily as needed. (Patient not taking: Reported on 11/01/2020)  3   JARDIANCE 25 MG TABS tablet Take 25 mg by mouth daily. (Patient not taking: Reported on 11/01/2020)  5   naproxen (NAPROSYN) 500 MG tablet Take 500 mg by mouth 2 (two) times daily. (Patient not taking: Reported on 11/01/2020)  2   polyethylene glycol (MIRALAX / GLYCOLAX) packet Take 17 g by mouth daily as needed. constipation   (Patient not taking: Reported on 11/01/2020)     propranolol (INDERAL) 40 MG tablet Take 40 mg by mouth daily. (Patient not taking: Reported on 11/01/2020)  3   sorbitol 70 % solution Take 15 mLs by mouth daily as needed. constipation  (Patient not taking: Reported on 11/01/2020)     No facility-administered medications prior to visit.    Allergies  Allergen Reactions   Doxycycline Nausea Only   Penicillins Anaphylaxis    .Has patient had a PCN reaction causing immediate rash, facial/tongue/throat swelling, SOB or lightheadedness with hypotension: Yes Has patient had a PCN reaction causing severe rash involving mucus membranes or skin necrosis: No Has patient had a PCN reaction that required hospitalization: Yes Has patient had a PCN reaction occurring within the last 10 years No If all of the above answers are "NO", then may proceed with Cephalosporin use.    Metronidazole Hives    ROS Review of Systems  Constitutional:  Negative for chills and fever.  HENT:  Negative for congestion and sore throat.   Eyes:  Negative for pain and discharge.  Respiratory:  Negative for cough and shortness of breath.   Cardiovascular:  Negative for chest pain and palpitations.  Gastrointestinal:  Positive for abdominal pain (chronic - epigastric and right lower quadrant). Negative for constipation, diarrhea, nausea and vomiting.  Endocrine: Negative for polydipsia and polyuria.  Genitourinary:  Negative for dysuria and hematuria.  Musculoskeletal:  Negative for neck pain and neck  stiffness.  Skin:  Negative for rash.  Neurological:  Negative for dizziness, weakness, numbness and headaches.  Psychiatric/Behavioral:  Negative for agitation and behavioral problems.      Objective:    Physical Exam Vitals reviewed.  Constitutional:      General: He is not in acute distress.    Appearance: He is not diaphoretic.  HENT:     Head: Normocephalic and atraumatic.     Nose: Nose normal.     Mouth/Throat:     Mouth: Mucous membranes are moist.  Eyes:     General: No scleral icterus.    Extraocular Movements: Extraocular movements intact.  Cardiovascular:     Rate and Rhythm: Normal rate and regular rhythm.     Pulses: Normal pulses.     Heart sounds: Normal heart sounds. No murmur heard. Pulmonary:     Breath sounds: Normal breath sounds. No wheezing or rales.  Abdominal:     Palpations: Abdomen is soft.     Tenderness: There is no abdominal tenderness.     Comments: Ileostomy tube in place  Musculoskeletal:     Cervical back: Neck supple. No tenderness.     Right lower leg: No edema.     Left lower leg: No edema.  Skin:    General: Skin is warm.     Findings: No rash.  Neurological:     General: No focal deficit present.     Mental Status: He is alert and oriented to person, place, and time.     Cranial Nerves: No cranial nerve deficit.     Sensory: No sensory deficit.  Psychiatric:        Mood and Affect: Mood normal.        Behavior: Behavior normal.    BP 140/77 (BP Location: Left Arm, Patient Position: Sitting, Cuff Size: Normal)   Pulse 77   Temp 98.4 F (36.9 C) (Oral)   Resp 18   Ht 5\' 9"  (1.753 m)   Wt 177 lb 1.9 oz (80.3 kg)   SpO2 95%   BMI 26.16 kg/m  Wt Readings from Last 3 Encounters:  11/01/20 177 lb 1.9 oz (80.3 kg)  10/03/17 175 lb (79.4 kg)  01/10/11 185 lb (83.9 kg)     Health Maintenance Due  Topic Date Due   Hepatitis C Screening  Never done   TETANUS/TDAP  Never done   Zoster Vaccines- Shingrix (1 of 2) Never done    COLONOSCOPY (Pts 45-33yrs Insurance coverage will need to be confirmed)  Never done   PNA vac Low Risk Adult (1 of 2 - PCV13) Never done   COVID-19 Vaccine (4 - Booster for Moderna series) 06/03/2020   INFLUENZA VACCINE  11/01/2020    There are no preventive care reminders to display for this patient.  No results found for: TSH Lab Results  Component Value Date   WBC 8.8 10/03/2017   HGB 17.5 (H) 10/03/2017   HCT 52.3 (H) 10/03/2017   MCV 90.5 10/03/2017   PLT 187 10/03/2017   Lab Results  Component Value Date   NA 138 10/03/2017   K 4.0 10/03/2017   CO2 14 (L) 10/03/2017   GLUCOSE 115 (H) 10/03/2017   BUN 71 (H) 10/03/2017   CREATININE 4.14 (H) 10/03/2017   BILITOT 0.8 10/03/2017   ALKPHOS 94 10/03/2017   AST 20 10/03/2017   ALT 24 10/03/2017   PROT 8.1 10/03/2017   ALBUMIN 3.8 10/03/2017   CALCIUM 9.1 10/03/2017   ANIONGAP 8 10/03/2017   No results found for: CHOL No results found for: HDL No results found for: LDLCALC No results found for: TRIG No results found for: CHOLHDL Lab Results  Component Value Date   HGBA1C 6.0 11/01/2020   HGBA1C 6.0 11/01/2020      Assessment & Plan:   Problem List Items Addressed This Visit       Cardiovascular and Mediastinum   Essential hypertension    BP Readings from Last 1 Encounters:  11/01/20 140/77  Well-controlled with Amlodipine Counseled for compliance with the medications Advised DASH diet and moderate exercise/walking as tolerated      Relevant Medications  amLODipine (NORVASC) 10 MG tablet     Respiratory   Chronic obstructive pulmonary disease (HCC)    Uses Albuterol inhaler PRN Not on any maintenance treatment       Relevant Medications   mometasone (NASONEX) 50 MCG/ACT nasal spray     Digestive   Gastroesophageal reflux disease    Takes Omeprazole         Genitourinary   ESRD (end stage renal disease) ([place])    Last BMP in EMR noted Follows up with Nephrology - has been advised for  HD Wants second opinion, referred to Dr Theador Hawthorne On Vitamin D, iron supplements and sodium bicarbonate         Other   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[place])    Has ileostomy in place, has h/o metastatic bladder cancer       Chronic pain    Chronic pain from h/o bladder cancer related surgery and ileostomy On Oxycodone - refilled for now Referred to pain management       Relevant Medications   oxycodone (OXY-IR) 5 MG capsule   Other Relevant Orders   Ambulatory referral to Pain Clinic   Other Visit Diagnoses     Encounter to establish care    -  Primary Care established History and medications reviewed with the patient    Screening for diabetes mellitus       Relevant Orders   POCT glycosylated hemoglobin (Hb A1C) (Completed)   Seasonal allergies       Relevant Medications   mometasone (NASONEX) 50 MCG/ACT nasal spray       Meds ordered this encounter  Medications   oxycodone (OXY-IR) 5 MG capsule    Sig: Take 1 capsule (5 mg total) by mouth in the morning and at bedtime. For pain    Dispense:  60 capsule    Refill:  0   mometasone (NASONEX) 50 MCG/ACT nasal spray    Sig: Place 2 sprays into the nose daily.    Dispense:  1 each    Refill:  5    Follow-up: Return in about 3 months (around 02/01/2021) for HTN and ESRD.    Lindell Spar, MD

## 2020-11-01 NOTE — Assessment & Plan Note (Signed)
Chronic pain from h/o bladder cancer related surgery and ileostomy On Oxycodone - refilled for now Referred to pain management

## 2020-11-01 NOTE — Assessment & Plan Note (Signed)
BP Readings from Last 1 Encounters:  11/01/20 140/77   Well-controlled with Amlodipine Counseled for compliance with the medications Advised DASH diet and moderate exercise/walking as tolerated

## 2020-11-05 ENCOUNTER — Telehealth: Payer: Self-pay

## 2020-11-05 NOTE — Telephone Encounter (Signed)
Raquel Sarna with [facility] office called to let us know they cant see the patient as they dont accept out of state pain management is there somewhere else I should send him ?

## 2020-11-08 NOTE — Telephone Encounter (Signed)
Please advise 

## 2020-12-16 ENCOUNTER — Telehealth: Payer: Self-pay | Admitting: Internal Medicine

## 2020-12-16 ENCOUNTER — Telehealth: Payer: Self-pay

## 2020-12-16 NOTE — Telephone Encounter (Signed)
Patient called about pain management referral request in [place], [place] call back #   [phone]

## 2020-12-16 NOTE — Telephone Encounter (Signed)
  No answer unable to leave a message for patient to call back and schedule Medicare Annual Wellness Visit (AWV) in office.   If unable to come into the office for AWV,  please offer to do virtually or by telephone.  No hx of AWV eligible for AWVI as of 06/01/2009  Please schedule at anytime with [place].      40 Minutes appointment   Any questions, please call me at [phone]

## 2020-12-23 ENCOUNTER — Ambulatory Visit (INDEPENDENT_AMBULATORY_CARE_PROVIDER_SITE_OTHER): Payer: Medicare Other

## 2020-12-23 ENCOUNTER — Other Ambulatory Visit: Payer: Self-pay

## 2020-12-23 DIAGNOSIS — Z5329 Procedure and treatment not carried out because of patient's decision for other reasons: Secondary | ICD-10-CM

## 2020-12-23 NOTE — Progress Notes (Deleted)
Subjective:   Shane Reeves is a 75 y.o. male who presents for an Initial Medicare Annual Wellness Visit. I connected with  Shane Reeves on 12/23/20 by a audio enabled telemedicine application and verified that I am speaking with the correct person using two identifiers.   I discussed the limitations of evaluation and management by telemedicine. The patient expressed understanding and agreed to proceed.   Location of patient:Home  Location of Provider:Office  Persons participating in virtual visit: Shane Reeves (patient) and Valli Glance Review of Systems    Defer to PCP       Objective:    There were no vitals filed for this visit. There is no height or weight on file to calculate BMI.  No flowsheet data found.  Current Medications (verified) Outpatient Encounter Medications as of 12/23/2020  Medication Sig   albuterol (ACCUNEB) 1.25 MG/3ML nebulizer solution Take 1 ampule by nebulization every 6 (six) hours as needed for wheezing. (Patient not taking: Reported on 11/01/2020)   albuterol (PROVENTIL HFA;VENTOLIN HFA) 108 (90 Base) MCG/ACT inhaler Inhale 1-2 puffs into the lungs every 6 (six) hours as needed for wheezing or shortness of breath. (Patient not taking: Reported on 11/01/2020)   amLODipine (NORVASC) 10 MG tablet Take 10 mg by mouth daily.   Cholecalciferol (VITAMIN D3) 1000 units CAPS Take 1 capsule by mouth daily.   docusate sodium (COLACE) 100 MG capsule Take 100 mg by mouth 2 (two) times daily.     ferrous sulfate 325 (65 FE) MG tablet Take 1 tablet by mouth 2 (two) times daily.   mometasone (NASONEX) 50 MCG/ACT nasal spray Place 2 sprays into the Reeves daily.   omeprazole (PRILOSEC) 20 MG capsule Take 20 mg by mouth daily.   oxycodone (OXY-IR) 5 MG capsule Take 1 capsule (5 mg total) by mouth in the morning and at bedtime. For pain   pravastatin (PRAVACHOL) 40 MG tablet Take 40 mg by mouth daily.   sodium bicarbonate 325 MG tablet Take 1 tablet by mouth 2  (two) times daily.   No facility-administered encounter medications on file as of 12/23/2020.    Allergies (verified) Doxycycline, Penicillins, and Metronidazole   History: Past Medical History:  Diagnosis Date   Hypertension    Renal disorder    Past Surgical History:  Procedure Laterality Date   BLADDER REMOVAL     EYE SURGERY     KIDNEY STONE SURGERY     No family history on file. Social History   Socioeconomic History   Marital status: Married    Spouse name: Not on file   Number of children: Not on file   Years of education: Not on file   Highest education level: Not on file  Occupational History   Not on file  Tobacco Use   Smoking status: Former    Passive exposure: Past   Smokeless tobacco: Never  Substance and Sexual Activity   Alcohol use: Yes    Comment: occasionally   Drug use: No   Sexual activity: Not on file  Other Topics Concern   Not on file  Social History Narrative   Not on file   Social Determinants of Health   Financial Resource Strain: Not on file  Food Insecurity: Not on file  Transportation Needs: Not on file  Physical Activity: Not on file  Stress: Not on file  Social Connections: Not on file    Tobacco Counseling Counseling given: Not Answered   Clinical Intake:  Diabetic?NO         Activities of Daily Living In your present state of health, do you have any difficulty performing the following activities: 11/01/2020  Hearing? N  Vision? N  Difficulty concentrating or making decisions? N  Walking or climbing stairs? Y  Dressing or bathing? N  Doing errands, shopping? N  Some recent data might be hidden    Patient Care Team: Lindell Spar, MD as PCP - General (Internal Medicine)  Indicate any recent Medical Services you may have received from other than [facility] providers in the past year (date may be approximate).     Assessment:   This is a routine wellness examination for  Shane Reeves.  Hearing/Vision screen No results found.  Dietary issues and exercise activities discussed:     Goals Addressed   None    Depression Screen PHQ 2/9 Scores 11/01/2020  PHQ - 2 Score 3  PHQ- 9 Score 4    Fall Risk Fall Risk  11/01/2020  Falls in the past year? 0  Number falls in past yr: 0  Injury with Fall? 0  Risk for fall due to : No Fall Risks  Follow up Falls evaluation completed    FALL RISK PREVENTION PERTAINING TO THE HOME:  Any stairs in or around the home? {YES/NO:21197} If so, are there any without handrails? {YES/NO:21197} Home free of loose throw rugs in walkways, pet beds, electrical cords, etc? {YES/NO:21197} Adequate lighting in your home to reduce risk of falls? {YES/NO:21197}  ASSISTIVE DEVICES UTILIZED TO PREVENT FALLS:  Life alert? {YES/NO:21197} Use of a cane, walker or w/c? {YES/NO:21197} Grab bars in the bathroom? {YES/NO:21197} Shower chair or bench in shower? {YES/NO:21197} Elevated toilet seat or a handicapped toilet? {YES/NO:21197}  TIMED UP AND GO:  Was the test performed?  N/A .  Length of time to ambulate 10 feet: N/A sec.     Cognitive Function:        Immunizations  There is no immunization history on file for this patient.  TDAP status: Due, Education has been provided regarding the importance of this vaccine. Advised may receive this vaccine at local pharmacy or Health Dept. Aware to provide a copy of the vaccination record if obtained from local pharmacy or Health Dept. Verbalized acceptance and understanding.  Flu Vaccine status: Due, Education has been provided regarding the importance of this vaccine. Advised may receive this vaccine at local pharmacy or Health Dept. Aware to provide a copy of the vaccination record if obtained from local pharmacy or Health Dept. Verbalized acceptance and understanding.    Covid-19 vaccine status: Information provided on how to obtain vaccines.   Qualifies for Shingles Vaccine?  Yes   Zostavax completed No   Shingrix Completed?: No.    Education has been provided regarding the importance of this vaccine. Patient has been advised to call insurance company to determine out of pocket expense if they have not yet received this vaccine. Advised may also receive vaccine at local pharmacy or Health Dept. Verbalized acceptance and understanding.  Screening Tests Health Maintenance  Topic Date Due   Hepatitis C Screening  Never done   TETANUS/TDAP  Never done   Zoster Vaccines- Shingrix (1 of 2) Never done   COLONOSCOPY (Pts 45-32yrs Insurance coverage will need to be confirmed)  Never done   COVID-19 Vaccine (4 - Booster for Moderna series) 05/28/2020   INFLUENZA VACCINE  11/01/2020   HPV VACCINES  Aged Out    Health Maintenance  Health Maintenance  Due  Topic Date Due   Hepatitis C Screening  Never done   TETANUS/TDAP  Never done   Zoster Vaccines- Shingrix (1 of 2) Never done   COLONOSCOPY (Pts 45-41yrs Insurance coverage will need to be confirmed)  Never done   COVID-19 Vaccine (4 - Booster for Moderna series) 05/28/2020   INFLUENZA VACCINE  11/01/2020    Colorectal cancer screening: No longer required.   Lung Cancer Screening: (Low Dose CT Chest recommended if Age 31-80 years, 30 pack-year currently smoking OR have quit w/in 15years.) {DOES NOT does:27190::"does not"} qualify.   Lung Cancer Screening Referral: NO  Additional Screening:  Hepatitis C Screening: does qualify; Not Completed   Vision Screening: Recommended annual ophthalmology exams for early detection of glaucoma and other disorders of the eye. Is the patient up to date with their annual eye exam?  {YES/NO:21197} Who is the provider or what is the name of the office in which the patient attends annual eye exams? *** If pt is not established with a provider, would they like to be referred to a provider to establish care? {YES/NO:21197}.   Dental Screening: Recommended annual dental exams for  proper oral hygiene  Community Resource Referral / Chronic Care Management: CRR required this visit?  {YES/NO:21197}  CCM required this visit?  {YES/NO:21197}     Plan:     I have personally reviewed and noted the following in the patient's chart:   Medical and social history Use of alcohol, tobacco or illicit drugs  Current medications and supplements including opioid prescriptions. {Opioid Prescriptions:7628327526} Functional ability and status Nutritional status Physical activity Advanced directives List of other physicians Hospitalizations, surgeries, and ER visits in previous 12 months Vitals Screenings to include cognitive, depression, and falls Referrals and appointments  In addition, I have reviewed and discussed with patient certain preventive protocols, quality metrics, and best practice recommendations. A written personalized care plan for preventive services as well as general preventive health recommendations were provided to patient.     Edgar Frisk, [facility]   12/23/2020   Nurse Notes: Non Face to Face 30 minute visit

## 2020-12-23 NOTE — Patient Instructions (Signed)
Health Maintenance, Male Adopting a healthy lifestyle and getting preventive care are important in promoting health and wellness. Ask your health care provider about: The right schedule for you to have regular tests and exams. Things you can do on your own to prevent diseases and keep yourself healthy. What should I know about diet, weight, and exercise? Eat a healthy diet  Eat a diet that includes plenty of vegetables, fruits, low-fat dairy products, and lean protein. Do not eat a lot of foods that are high in solid fats, added sugars, or sodium. Maintain a healthy weight Body mass index (BMI) is a measurement that can be used to identify possible weight problems. It estimates body fat based on height and weight. Your health care provider can help determine your BMI and help you achieve or maintain a healthy weight. Get regular exercise Get regular exercise. This is one of the most important things you can do for your health. Most adults should: Exercise for at least 150 minutes each week. The exercise should increase your heart rate and make you sweat (moderate-intensity exercise). Do strengthening exercises at least twice a week. This is in addition to the moderate-intensity exercise. Spend less time sitting. Even light physical activity can be beneficial. Watch cholesterol and blood lipids Have your blood tested for lipids and cholesterol at 75 years of age, then have this test every 5 years. You may need to have your cholesterol levels checked more often if: Your lipid or cholesterol levels are high. You are older than 75 years of age. You are at high risk for heart disease. What should I know about cancer screening? Many types of cancers can be detected early and may often be prevented. Depending on your health history and family history, you may need to have cancer screening at various ages. This may include screening for: Colorectal cancer. Prostate cancer. Skin cancer. Lung  cancer. What should I know about heart disease, diabetes, and high blood pressure? Blood pressure and heart disease High blood pressure causes heart disease and increases the risk of stroke. This is more likely to develop in people who have high blood pressure readings, are of African descent, or are overweight. Talk with your health care provider about your target blood pressure readings. Have your blood pressure checked: Every 3-5 years if you are 18-39 years of age. Every year if you are 40 years old or older. If you are between the ages of 65 and 75 and are a current or former smoker, ask your health care provider if you should have a one-time screening for abdominal aortic aneurysm (AAA). Diabetes Have regular diabetes screenings. This checks your fasting blood sugar level. Have the screening done: Once every three years after age 45 if you are at a normal weight and have a low risk for diabetes. More often and at a younger age if you are overweight or have a high risk for diabetes. What should I know about preventing infection? Hepatitis B If you have a higher risk for hepatitis B, you should be screened for this virus. Talk with your health care provider to find out if you are at risk for hepatitis B infection. Hepatitis C Blood testing is recommended for: Everyone born from 1945 through 1965. Anyone with known risk factors for hepatitis C. Sexually transmitted infections (STIs) You should be screened each year for STIs, including gonorrhea and chlamydia, if: You are sexually active and are younger than 75 years of age. You are older than 75 years   of age and your health care provider tells you that you are at risk for this type of infection. Your sexual activity has changed since you were last screened, and you are at increased risk for chlamydia or gonorrhea. Ask your health care provider if you are at risk. Ask your health care provider about whether you are at high risk for HIV.  Your health care provider may recommend a prescription medicine to help prevent HIV infection. If you choose to take medicine to prevent HIV, you should first get tested for HIV. You should then be tested every 3 months for as long as you are taking the medicine. Follow these instructions at home: Lifestyle Do not use any products that contain nicotine or tobacco, such as cigarettes, e-cigarettes, and chewing tobacco. If you need help quitting, ask your health care provider. Do not use street drugs. Do not share needles. Ask your health care provider for help if you need support or information about quitting drugs. Alcohol use Do not drink alcohol if your health care provider tells you not to drink. If you drink alcohol: Limit how much you have to 0-2 drinks a day. Be aware of how much alcohol is in your drink. In the [place] one drink equals one 12 oz bottle of beer (355 mL), one 5 oz glass of wine (148 mL), or one 1 oz glass of hard liquor (44 mL). General instructions Schedule regular health, dental, and eye exams. Stay current with your vaccines. Tell your health care provider if: You often feel depressed. You have ever been abused or do not feel safe at home. Summary Adopting a healthy lifestyle and getting preventive care are important in promoting health and wellness. Follow your health care provider's instructions about healthy diet, exercising, and getting tested or screened for diseases. Follow your health care provider's instructions on monitoring your cholesterol and blood pressure. This information is not intended to replace advice given to you by your health care provider. Make sure you discuss any questions you have with your health care provider. Document Revised: 05/28/2020 Document Reviewed: 03/13/2018 Elsevier Patient Education  2022 [employer] Inc[employer]  

## 2021-02-01 ENCOUNTER — Ambulatory Visit: Payer: Medicare Other | Admitting: Internal Medicine

## 2021-02-01 ENCOUNTER — Other Ambulatory Visit: Payer: Self-pay

## 2021-02-01 ENCOUNTER — Encounter: Payer: Self-pay | Admitting: Internal Medicine

## 2021-02-01 VITALS — BP 132/76 | HR 72 | Temp 98.6°F | Resp 18 | Ht 69.0 in | Wt 176.0 lb

## 2021-02-01 DIAGNOSIS — G894 Chronic pain syndrome: Secondary | ICD-10-CM

## 2021-02-01 DIAGNOSIS — Z932 Ileostomy status: Secondary | ICD-10-CM | POA: Diagnosis not present

## 2021-02-01 DIAGNOSIS — Z23 Encounter for immunization: Secondary | ICD-10-CM | POA: Diagnosis not present

## 2021-02-01 DIAGNOSIS — I10 Essential (primary) hypertension: Secondary | ICD-10-CM

## 2021-02-01 DIAGNOSIS — N186 End stage renal disease: Secondary | ICD-10-CM | POA: Diagnosis not present

## 2021-02-01 MED ORDER — OXYCODONE HCL 5 MG PO TABS: 5.0000 mg | ORAL_TABLET | ORAL | 0 refills | Status: DC | PRN

## 2021-02-01 NOTE — Assessment & Plan Note (Signed)
Chronic pain from h/o bladder cancer related surgery and ileostomy On Oxycodone - refilled for now Referred to pain management

## 2021-02-01 NOTE — Progress Notes (Signed)
Established Patient Office Visit  Subjective:  Patient ID: Shane Reeves, male    DOB: 01-Dec-1945  Age: 75 y.o. MRN: [id number]  CC:  Chief Complaint  Patient presents with   Follow-up    3 month follow up pt has been having kidney problems for awhile    HPI Shane Reeves  is a 75 year old male with PMH of HTN, COPD, GERD, ESRD, malignant bladder cancer s/p resection and ileostomy placement and chronic pain syndrome who presents for f/u of HTN and chronic pain.  HTN: BP is well-controlled. Takes medications regularly. Patient denies headache, dizziness, chest pain, dyspnea or palpitations.  ESRD: Has been following up with Nephrology. States that he denied HD in the past. He also has h/o frequent nephrolithiasis and UTIs in the past.  Chronic pain: He c/o chronic back pain and knee pain. He had been on Oxycodone for pain, but recently ran out of it. He has not heard from pain clinic yet.  Ileostomy: Has ileostomy tube in place from surgery for bladder cancer according to him.  Past Medical History:  Diagnosis Date   Hypertension    Renal disorder     Past Surgical History:  Procedure Laterality Date   BLADDER REMOVAL     EYE SURGERY     KIDNEY STONE SURGERY      History reviewed. No pertinent family history.  Social History   Socioeconomic History   Marital status: Married    Spouse name: Not on file   Number of children: Not on file   Years of education: Not on file   Highest education level: Not on file  Occupational History   Not on file  Tobacco Use   Smoking status: Former    Passive exposure: Past   Smokeless tobacco: Never  Substance and Sexual Activity   Alcohol use: Yes    Comment: occasionally   Drug use: No   Sexual activity: Not on file  Other Topics Concern   Not on file  Social History Narrative   Not on file   Social Determinants of Health   Financial Resource Strain: Not on file  Food Insecurity: Not on file  Transportation  Needs: Not on file  Physical Activity: Not on file  Stress: Not on file  Social Connections: Not on file  Intimate Partner Violence: Not on file    Outpatient Medications Prior to Visit  Medication Sig Dispense Refill   albuterol (ACCUNEB) 1.25 MG/3ML nebulizer solution Take 1 ampule by nebulization every 6 (six) hours as needed for wheezing.     albuterol (PROVENTIL HFA;VENTOLIN HFA) 108 (90 Base) MCG/ACT inhaler Inhale 1-2 puffs into the lungs every 6 (six) hours as needed for wheezing or shortness of breath.     amLODipine (NORVASC) 10 MG tablet Take 10 mg by mouth daily.     Cholecalciferol (VITAMIN D3) 1000 units CAPS Take 1 capsule by mouth daily.     docusate sodium (COLACE) 100 MG capsule Take 100 mg by mouth 2 (two) times daily.       ferrous sulfate 325 (65 FE) MG tablet Take 1 tablet by mouth 2 (two) times daily.     mometasone (NASONEX) 50 MCG/ACT nasal spray Place 2 sprays into the nose daily. 1 each 5   omeprazole (PRILOSEC) 20 MG capsule Take 20 mg by mouth daily.  3   pravastatin (PRAVACHOL) 40 MG tablet Take 40 mg by mouth daily.  6   sodium bicarbonate 325 MG tablet Take 1  tablet by mouth 2 (two) times daily.     oxycodone (OXY-IR) 5 MG capsule Take 1 capsule (5 mg total) by mouth in the morning and at bedtime. For pain (Patient not taking: Reported on 02/01/2021) 60 capsule 0   No facility-administered medications prior to visit.    Allergies  Allergen Reactions   Doxycycline Nausea Only   Penicillins Anaphylaxis    .Has patient had a PCN reaction causing immediate rash, facial/tongue/throat swelling, SOB or lightheadedness with hypotension: Yes Has patient had a PCN reaction causing severe rash involving mucus membranes or skin necrosis: No Has patient had a PCN reaction that required hospitalization: Yes Has patient had a PCN reaction occurring within the last 10 years No If all of the above answers are "NO", then may proceed with Cephalosporin use.     Metronidazole Hives    ROS Review of Systems  Constitutional:  Negative for chills and fever.  HENT:  Negative for congestion and sore throat.   Eyes:  Negative for pain and discharge.  Respiratory:  Negative for cough and shortness of breath.   Cardiovascular:  Negative for chest pain and palpitations.  Gastrointestinal:  Negative for constipation, diarrhea, nausea and vomiting.  Endocrine: Negative for polydipsia and polyuria.  Genitourinary:  Negative for dysuria and hematuria.  Musculoskeletal:  Positive for arthralgias and back pain. Negative for neck pain and neck stiffness.  Skin:  Negative for rash.  Neurological:  Negative for dizziness, weakness, numbness and headaches.  Psychiatric/Behavioral:  Negative for agitation and behavioral problems.      Objective:    Physical Exam Vitals reviewed.  Constitutional:      General: He is not in acute distress.    Appearance: He is not diaphoretic.  HENT:     Head: Normocephalic and atraumatic.     Nose: Nose normal.     Mouth/Throat:     Mouth: Mucous membranes are moist.  Eyes:     General: No scleral icterus.    Extraocular Movements: Extraocular movements intact.  Cardiovascular:     Rate and Rhythm: Normal rate and regular rhythm.     Pulses: Normal pulses.     Heart sounds: Normal heart sounds. No murmur heard. Pulmonary:     Breath sounds: Normal breath sounds. No wheezing or rales.  Abdominal:     Palpations: Abdomen is soft.     Tenderness: There is no abdominal tenderness.     Comments: Ileostomy tube in place  Musculoskeletal:     Cervical back: Neck supple. No tenderness.     Right lower leg: No edema.     Left lower leg: No edema.  Skin:    General: Skin is warm.     Findings: No rash.  Neurological:     General: No focal deficit present.     Mental Status: He is alert and oriented to person, place, and time.     Cranial Nerves: No cranial nerve deficit.     Sensory: No sensory deficit.   Psychiatric:        Mood and Affect: Mood normal.        Behavior: Behavior normal.    BP 132/76 (BP Location: Left Arm, Patient Position: Sitting, Cuff Size: Normal)   Pulse 72   Temp 98.6 F (37 C) (Oral)   Resp 18   Ht 5\' 9"  (1.753 m)   Wt 176 lb (79.8 kg)   SpO2 93%   BMI 25.99 kg/m  Wt Readings from Last 3 Encounters:  02/01/21 176 lb (79.8 kg)  11/01/20 177 lb 1.9 oz (80.3 kg)  10/03/17 175 lb (79.4 kg)     Health Maintenance Due  Topic Date Due   Pneumonia Vaccine 48+ Years old (1 - PCV) Never done   Hepatitis C Screening  Never done   TETANUS/TDAP  Never done   Zoster Vaccines- Shingrix (1 of 2) Never done   COLONOSCOPY (Pts 45-85yrs Insurance coverage will need to be confirmed)  Never done   COVID-19 Vaccine (4 - Booster for Moderna series) 04/30/2020    There are no preventive care reminders to display for this patient.  No results found for: TSH Lab Results  Component Value Date   WBC 8.8 10/03/2017   HGB 17.5 (H) 10/03/2017   HCT 52.3 (H) 10/03/2017   MCV 90.5 10/03/2017   PLT 187 10/03/2017   Lab Results  Component Value Date   NA 138 10/03/2017   K 4.0 10/03/2017   CO2 14 (L) 10/03/2017   GLUCOSE 115 (H) 10/03/2017   BUN 71 (H) 10/03/2017   CREATININE 4.14 (H) 10/03/2017   BILITOT 0.8 10/03/2017   ALKPHOS 94 10/03/2017   AST 20 10/03/2017   ALT 24 10/03/2017   PROT 8.1 10/03/2017   ALBUMIN 3.8 10/03/2017   CALCIUM 9.1 10/03/2017   ANIONGAP 8 10/03/2017   No results found for: CHOL No results found for: HDL No results found for: LDLCALC No results found for: TRIG No results found for: CHOLHDL Lab Results  Component Value Date   HGBA1C 6.0 11/01/2020   HGBA1C 6.0 11/01/2020      Assessment & Plan:   Problem List Items Addressed This Visit       Cardiovascular and Mediastinum   Essential hypertension    BP Readings from Last 1 Encounters:  02/01/21 132/76  Well-controlled with Amlodipine Counseled for compliance with the  medications Advised DASH diet and moderate exercise/walking as tolerated        Genitourinary   ESRD (end stage renal disease) ([place]) - Primary    Last BMP in EMR noted Follows up with Nephrology - has been advised for HD in the past On Vitamin D, iron supplements and sodium bicarbonate        Other   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[place])    Has ileostomy in place, has h/o metastatic bladder cancer      Chronic pain    Chronic pain from h/o bladder cancer related surgery and ileostomy On Oxycodone - refilled for now Referred to pain management      Relevant Medications   oxyCODONE (OXY IR/ROXICODONE) 5 MG immediate release tablet   Other Relevant Orders   Ambulatory referral to Pain Clinic   Other Visit Diagnoses     Need for immunization against influenza       Relevant Orders   Flu Vaccine QUAD High Dose(Fluad) (Completed)       Meds ordered this encounter  Medications   oxyCODONE (OXY IR/ROXICODONE) 5 MG immediate release tablet    Sig: Take 1 tablet (5 mg total) by mouth every 4 (four) hours as needed for severe pain.    Dispense:  60 tablet    Refill:  0    Follow-up: Return in about 3 months (around 05/04/2021) for HTN, ESRD and chronic pain.    Lindell Spar, MD

## 2021-02-01 NOTE — Assessment & Plan Note (Signed)
Last BMP in EMR noted Follows up with Nephrology - has been advised for HD in the past On Vitamin D, iron supplements and sodium bicarbonate

## 2021-02-01 NOTE — Assessment & Plan Note (Signed)
BP Readings from Last 1 Encounters:  02/01/21 132/76   Well-controlled with Amlodipine Counseled for compliance with the medications Advised DASH diet and moderate exercise/walking as tolerated

## 2021-02-01 NOTE — Assessment & Plan Note (Signed)
Has ileostomy in place, has h/o metastatic bladder cancer

## 2021-02-01 NOTE — Patient Instructions (Signed)
Please continue taking medications as prescribed.  You are being referred to Pain clinic in [place].

## 2021-04-20 ENCOUNTER — Other Ambulatory Visit: Payer: Self-pay | Admitting: Internal Medicine

## 2021-04-20 DIAGNOSIS — J302 Other seasonal allergic rhinitis: Secondary | ICD-10-CM

## 2021-05-04 ENCOUNTER — Ambulatory Visit: Payer: Medicare Other | Admitting: Internal Medicine

## 2021-05-04 ENCOUNTER — Ambulatory Visit (INDEPENDENT_AMBULATORY_CARE_PROVIDER_SITE_OTHER): Payer: Medicare Other

## 2021-05-04 ENCOUNTER — Other Ambulatory Visit: Payer: Self-pay

## 2021-05-04 ENCOUNTER — Encounter: Payer: Self-pay | Admitting: Internal Medicine

## 2021-05-04 VITALS — BP 142/90 | HR 69 | Resp 18 | Ht 69.0 in | Wt 183.1 lb

## 2021-05-04 DIAGNOSIS — J449 Chronic obstructive pulmonary disease, unspecified: Secondary | ICD-10-CM | POA: Diagnosis not present

## 2021-05-04 DIAGNOSIS — G894 Chronic pain syndrome: Secondary | ICD-10-CM | POA: Diagnosis not present

## 2021-05-04 DIAGNOSIS — E782 Mixed hyperlipidemia: Secondary | ICD-10-CM

## 2021-05-04 DIAGNOSIS — Z0000 Encounter for general adult medical examination without abnormal findings: Secondary | ICD-10-CM | POA: Diagnosis not present

## 2021-05-04 DIAGNOSIS — I10 Essential (primary) hypertension: Secondary | ICD-10-CM | POA: Diagnosis not present

## 2021-05-04 DIAGNOSIS — R7303 Prediabetes: Secondary | ICD-10-CM

## 2021-05-04 DIAGNOSIS — Z932 Ileostomy status: Secondary | ICD-10-CM

## 2021-05-04 DIAGNOSIS — N186 End stage renal disease: Secondary | ICD-10-CM | POA: Diagnosis not present

## 2021-05-04 DIAGNOSIS — K219 Gastro-esophageal reflux disease without esophagitis: Secondary | ICD-10-CM

## 2021-05-04 DIAGNOSIS — F1190 Opioid use, unspecified, uncomplicated: Secondary | ICD-10-CM

## 2021-05-04 MED ORDER — PRAVASTATIN SODIUM 40 MG PO TABS: 40.0000 mg | ORAL_TABLET | Freq: Every day | ORAL | 3 refills | Status: DC

## 2021-05-04 MED ORDER — OMEPRAZOLE 20 MG PO CPDR: 20.0000 mg | DELAYED_RELEASE_CAPSULE | Freq: Every day | ORAL | 3 refills | Status: DC

## 2021-05-04 MED ORDER — SODIUM BICARBONATE 325 MG PO TABS: 325.0000 mg | ORAL_TABLET | Freq: Two times a day (BID) | ORAL | 3 refills | Status: DC

## 2021-05-04 MED ORDER — AMLODIPINE BESYLATE 10 MG PO TABS: 10.0000 mg | ORAL_TABLET | Freq: Every day | ORAL | 3 refills | Status: DC

## 2021-05-04 MED ORDER — OXYCODONE HCL 5 MG PO TABS: 5.0000 mg | ORAL_TABLET | ORAL | 0 refills | Status: DC | PRN

## 2021-05-04 NOTE — Assessment & Plan Note (Signed)
Takes Omeprazole, well-controlled

## 2021-05-04 NOTE — Assessment & Plan Note (Signed)
Last BMP in EMR noted Follows up with Nephrology - has been advised for HD in the past On Vitamin D, iron supplements and sodium bicarbonate Going to see urology as he has history of multiple urologic procedures

## 2021-05-04 NOTE — Patient Instructions (Addendum)
Please continue taking medications as prescribed.  Please continue to follow up with your kidney doctor.  Please get fasting blood tests done before the next visit.

## 2021-05-04 NOTE — Assessment & Plan Note (Signed)
On statin, refilled Check lipid profile in the next visit

## 2021-05-04 NOTE — Assessment & Plan Note (Signed)
Has ileostomy in place, has h/o metastatic bladder cancer, going to see urologist

## 2021-05-04 NOTE — Patient Instructions (Signed)

## 2021-05-04 NOTE — Assessment & Plan Note (Signed)
BP Readings from Last 1 Encounters:  05/04/21 (!) 142/90   Uncontrolled with Amlodipine, could be elevated due to chronic pain he has he had run out of his pain med Counseled for compliance with the medications Advised DASH diet and moderate exercise/walking as tolerated

## 2021-05-04 NOTE — Assessment & Plan Note (Signed)
Chronic pain from h/o bladder cancer related surgery and ileostomy On Oxycodone - refilled for now Referred to pain management - but did not like the clinic as he had to talk to provider on video only and did not prescribe pain medicine to him

## 2021-05-04 NOTE — Progress Notes (Signed)
Subjective:   Shane Reeves is a 76 y.o. male who presents for an Initial Medicare Annual Wellness Visit.  Review of Systems    Defer to PCP Cardiac Risk Factors include: advanced age (>77men, >23 women);hypertension;male gender;smoking/ tobacco exposure     Objective:    Today's Vitals   05/04/21 1521  PainSc: 4    There is no height or weight on file to calculate BMI.  Advanced Directives 05/04/2021  Does Patient Have a Medical Advance Directive? No  Would patient like information on creating a medical advance directive? No - Patient declined    Current Medications (verified) Outpatient Encounter Medications as of 05/04/2021  Medication Sig   albuterol (ACCUNEB) 1.25 MG/3ML nebulizer solution Take 1 ampule by nebulization every 6 (six) hours as needed for wheezing.   albuterol (PROVENTIL HFA;VENTOLIN HFA) 108 (90 Base) MCG/ACT inhaler Inhale 1-2 puffs into the lungs every 6 (six) hours as needed for wheezing or shortness of breath.   amLODipine (NORVASC) 10 MG tablet Take 1 tablet (10 mg total) by mouth daily.   Cholecalciferol (VITAMIN D3) 1000 units CAPS Take 1 capsule by mouth daily.   docusate sodium (COLACE) 100 MG capsule Take 100 mg by mouth 2 (two) times daily.     ferrous sulfate 325 (65 FE) MG tablet Take 1 tablet by mouth 2 (two) times daily.   mometasone (NASONEX) 50 MCG/ACT nasal spray PLACE 2 SPRAYS INTO THE NOSE DAILY.   omeprazole (PRILOSEC) 20 MG capsule Take 1 capsule (20 mg total) by mouth daily.   oxyCODONE (OXY IR/ROXICODONE) 5 MG immediate release tablet Take 1 tablet (5 mg total) by mouth every 4 (four) hours as needed for severe pain.   pravastatin (PRAVACHOL) 40 MG tablet Take 1 tablet (40 mg total) by mouth daily.   sodium bicarbonate 325 MG tablet Take 1 tablet (325 mg total) by mouth 2 (two) times daily.   No facility-administered encounter medications on file as of 05/04/2021.    Allergies (verified) Doxycycline, Penicillins, and Metronidazole    History: Past Medical History:  Diagnosis Date   Hypertension    Renal disorder    Past Surgical History:  Procedure Laterality Date   BLADDER REMOVAL     EYE SURGERY     KIDNEY STONE SURGERY     History reviewed. No pertinent family history. Social History   Socioeconomic History   Marital status: Married    Spouse name: Shane Reeves   Number of children: 3   Years of education: 12   Highest education level: Not on file  Occupational History   Not on file  Tobacco Use   Smoking status: Former    Packs/day: 2.50    Years: 44.00    Pack years: 110.00    Types: Cigarettes    Start date: 1961    Quit date: 2006    Years since quitting: 17.0    Passive exposure: Past   Smokeless tobacco: Never  Vaping Use   Vaping Use: Never used  Substance and Sexual Activity   Alcohol use: Yes    Alcohol/week: 1.0 standard drink    Types: 1 Cans of beer per week    Comment: occasionally   Drug use: No   Sexual activity: Yes  Other Topics Concern   Not on file  Social History Narrative   Not on file   Social Determinants of Health   Financial Resource Strain: Low Risk    Difficulty of Paying Living Expenses: Not hard at all  Food Insecurity: No Food Insecurity   Worried About Charity fundraiser in the Last Year: Never true   Ran Out of Food in the Last Year: Never true  Transportation Needs: No Transportation Needs   Lack of Transportation (Medical): No   Lack of Transportation (Non-Medical): No  Physical Activity: Inactive   Days of Exercise per Week: 0 days   Minutes of Exercise per Session: 0 min  Stress: No Stress Concern Present   Feeling of Stress : Not at all  Social Connections: Socially Isolated   Frequency of Communication with Friends and Family: Once a week   Frequency of Social Gatherings with Friends and Family: Never   Attends Religious Services: Never   Printmaker: No   Attends Music therapist: Never   Marital  Status: Married    Tobacco Counseling Counseling given: Not Answered   Clinical Intake:  Pre-visit preparation completed: No  Pain : 0-10 Pain Score: 4  Pain Location: Shoulder Pain Radiating Towards: ALL OVER arthritis Pain Descriptors / Indicators: Aching, Sore, Throbbing Pain Onset: More than a month ago Pain Frequency: Constant     Nutritional Risks: Unintentional weight gain Diabetes: No  How often do you need to have someone help you when you read instructions, pamphlets, or other written materials from your doctor or pharmacy?: 1 - Never What is the last grade level you completed in school?: 12  Diabetic?no  Interpreter Needed?: No  Information entered by :: [place] of Daily Living In your present state of health, do you have any difficulty performing the following activities: 05/04/2021 11/01/2020  Hearing? N N  Vision? N N  Difficulty concentrating or making decisions? Y N  Walking or climbing stairs? N Y  Dressing or bathing? N N  Doing errands, shopping? N N  Preparing Food and eating ? N -  Using the Toilet? N -  In the past six months, have you accidently leaked urine? N -  Do you have problems with loss of bowel control? N -  Managing your Medications? N -  Managing your Finances? N -  Housekeeping or managing your Housekeeping? N -  Some recent data might be hidden    Patient Care Team: Lindell Spar, MD as PCP - General (Internal Medicine)  Indicate any recent Medical Services you may have received from other than [facility] providers in the past year (date may be approximate).     Assessment:   This is a routine wellness examination for Shane Reeves.  Hearing/Vision screen No results found.  Dietary issues and exercise activities discussed:     Goals Addressed   None   Depression Screen PHQ 2/9 Scores 05/04/2021 05/04/2021 02/01/2021 11/01/2020  PHQ - 2 Score 0 0 0 3  PHQ- 9 Score - - - 4    Fall Risk Fall Risk  05/04/2021 05/04/2021  02/01/2021 11/01/2020  Falls in the past year? 0 0 0 0  Number falls in past yr: 0 0 0 0  Injury with Fall? 0 0 0 0  Risk for fall due to : Impaired mobility No Fall Risks No Fall Risks No Fall Risks  Follow up Falls evaluation completed Falls evaluation completed Falls evaluation completed Falls evaluation completed    [place]:  Any stairs in or around the home? Yes  If so, are there any without handrails? Yes  Home free of loose throw rugs in walkways, pet beds,  electrical cords, etc? Yes  Adequate lighting in your home to reduce risk of falls? Yes   ASSISTIVE DEVICES UTILIZED TO PREVENT FALLS:  Life alert? No  Use of a cane, walker or w/c? Yes  Grab bars in the bathroom? No  Shower chair or bench in shower? No  Elevated toilet seat or a handicapped toilet? Yes   TIMED UP AND GO:  Was the test performed? Yes .  Length of time to ambulate 10 feet: 9 sec.   Gait slow and steady without use of assistive device  Cognitive Function:     6CIT Screen 05/04/2021  What Year? 0 points  What month? 0 points  What time? 0 points  Count back from 20 0 points  Months in reverse 0 points  Repeat phrase 10 points  Total Score 10    Immunizations Immunization History  Administered Date(s) Administered   Fluad Quad(high Dose 65+) 02/01/2021   Influenza Split 02/21/2018, 02/01/2021   Influenza, High Dose Seasonal PF 09/06/2017   Influenza, Seasonal, Injecte, Preservative Fre 02/03/2013, 02/19/2014   Influenza-Unspecified 01/08/2012   Moderna Sars-Covid-2 Vaccination 06/16/2019, 07/17/2019, 03/05/2020    TDAP status: Due, Education has been provided regarding the importance of this vaccine. Advised may receive this vaccine at local pharmacy or Health Dept. Aware to provide a copy of the vaccination record if obtained from local pharmacy or Health Dept. Verbalized acceptance and understanding.  Flu Vaccine status: Up to date  Pneumococcal vaccine  status: Due, Education has been provided regarding the importance of this vaccine. Advised may receive this vaccine at local pharmacy or Health Dept. Aware to provide a copy of the vaccination record if obtained from local pharmacy or Health Dept. Verbalized acceptance and understanding.  Covid-19 vaccine status: Information provided on how to obtain vaccines.   Qualifies for Shingles Vaccine? Yes   Zostavax completed No   Shingrix Completed?: No.    Education has been provided regarding the importance of this vaccine. Patient has been advised to call insurance company to determine out of pocket expense if they have not yet received this vaccine. Advised may also receive vaccine at local pharmacy or Health Dept. Verbalized acceptance and understanding.  Screening Tests Health Maintenance  Topic Date Due   Pneumonia Vaccine 23+ Years old (1 - PCV) Never done   Hepatitis C Screening  Never done   TETANUS/TDAP  Never done   Zoster Vaccines- Shingrix (1 of 2) Never done   COVID-19 Vaccine (4 - Booster for Moderna series) 04/30/2020   COLONOSCOPY (Pts 45-98yrs Insurance coverage will need to be confirmed)  05/04/2022 (Originally 09/24/1990)   INFLUENZA VACCINE  Completed   HPV VACCINES  Aged Out    Health Maintenance  Health Maintenance Due  Topic Date Due   Pneumonia Vaccine 59+ Years old (1 - PCV) Never done   Hepatitis C Screening  Never done   TETANUS/TDAP  Never done   Zoster Vaccines- Shingrix (1 of 2) Never done   COVID-19 Vaccine (4 - Booster for Moderna series) 04/30/2020    Colorectal cancer screening: No longer required.   Lung Cancer Screening: (Low Dose CT Chest recommended if Age 48-80 years, 30 pack-year currently smoking OR have quit w/in 15years.) does qualify.   Lung Cancer Screening Referral: 05/04/2021  Additional Screening:  Hepatitis C Screening: does not qualify; Completed not at high risk  Vision Screening: Recommended annual ophthalmology exams for early  detection of glaucoma and other disorders of the eye. Is the patient up to  date with their annual eye exam?  No  Who is the provider or what is the name of the office in which the patient attends annual eye exams? Eye dr an [facility] If pt is not established with a provider, would they like to be referred to a provider to establish care? No .   Dental Screening: Recommended annual dental exams for proper oral hygiene  Community Resource Referral / Chronic Care Management: CRR required this visit?  No   CCM required this visit?  No      Plan:     I have personally reviewed and noted the following in the patients chart:   Medical and social history Use of alcohol, tobacco or illicit drugs  Current medications and supplements including opioid prescriptions. Patient is currently taking opioid prescriptions. Information provided to patient regarding non-opioid alternatives. Patient advised to discuss non-opioid treatment plan with their provider. Functional ability and status Nutritional status Physical activity Advanced directives List of other physicians Hospitalizations, surgeries, and ER visits in previous 12 months Vitals Screenings to include cognitive, depression, and falls Referrals and appointments  In addition, I have reviewed and discussed with patient certain preventive protocols, quality metrics, and best practice recommendations. A written personalized care plan for preventive services as well as general preventive health recommendations were provided to patient.     Earline Mayotte, [place]   05/04/2021   Nurse Notes:  Mr. Shane Reeves , Thank you for taking time to come for your Medicare Wellness Visit. I appreciate your ongoing commitment to your health goals. Please review the following plan we discussed and let me know if I can assist you in the future.   These are the goals we discussed:  Goals   None     This is a list of the screening recommended for you and  due dates:  Health Maintenance  Topic Date Due   Pneumonia Vaccine (1 - PCV) Never done   Hepatitis C Screening: USPSTF Recommendation to screen - Ages 36-79 yo.  Never done   Tetanus Vaccine  Never done   Zoster (Shingles) Vaccine (1 of 2) Never done   COVID-19 Vaccine (4 - Booster for Moderna series) 04/30/2020   Colon Cancer Screening  05/04/2022*   Flu Shot  Completed   HPV Vaccine  Aged Out  *Topic was postponed. The date shown is not the original due date.

## 2021-05-04 NOTE — Assessment & Plan Note (Signed)
Uses Albuterol inhaler PRN Not on any maintenance treatment

## 2021-05-04 NOTE — Progress Notes (Signed)
Established Patient Office Visit  Subjective:  Patient ID: Shane Reeves, male    DOB: Aug 07, 1945  Age: 76 y.o. MRN: [id number]  CC:  Chief Complaint  Patient presents with   Follow-up    3 month follow up HTN ESRD Chronic pain pain is still bad not happy with pain management would like medication refills needs 90 day supply     HPI Shane Reeves is a 76 y.o. 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  HTN: BP is elevated today, which could be due to chronic back and knee pain as he has run out of his oxycodone. Takes medications regularly. Patient denies headache, dizziness, chest pain, dyspnea or palpitations.  ESRD: Has been following up with Nephrology. States that he denied HD in the past. He also has h/o frequent nephrolithiasis and UTIs in the past.  He is going to see urology for it.  Chronic pain: He c/o chronic back pain and knee pain. He had been on Oxycodone for pain, but recently ran out of it. He has been seen at pain clinic, but he could only do video visit with the provider although patient went to their office in [place].  Ileostomy: Has ileostomy tube in place from surgery for bladder cancer according to him.   Past Medical History:  Diagnosis Date   Hypertension    Renal disorder     Past Surgical History:  Procedure Laterality Date   BLADDER REMOVAL     EYE SURGERY     KIDNEY STONE SURGERY      History reviewed. No pertinent family history.  Social History   Socioeconomic History   Marital status: Married    Spouse name: bettie   Number of children: 3   Years of education: 12   Highest education level: Not on file  Occupational History   Not on file  Tobacco Use   Smoking status: Former    Packs/day: 2.50    Years: 44.00    Pack years: 110.00    Types: Cigarettes    Start date:  1961    Quit date: 2006    Years since quitting: 17.0    Passive exposure: Past   Smokeless tobacco: Never  Vaping Use   Vaping Use: Never used  Substance and Sexual Activity   Alcohol use: Yes    Alcohol/week: 1.0 standard drink    Types: 1 Cans of beer per week    Comment: occasionally   Drug use: No   Sexual activity: Yes  Other Topics Concern   Not on file  Social History Narrative   Not on file   Social Determinants of Health   Financial Resource Strain: Low Risk    Difficulty of Paying Living Expenses: Not hard at all  Food Insecurity: No Food Insecurity   Worried About Charity fundraiser in the Last Year: Never true   [place] in the Last Year: Never true  Transportation Needs: No Transportation Needs   Lack of Transportation (Medical): No   Lack of Transportation (Non-Medical): No  Physical Activity: Inactive   Days of Exercise per Week: 0 days   Minutes of Exercise per Session: 0 min  Stress: No Stress Concern Present   Feeling of Stress : Not at all  Social Connections: Socially Isolated   Frequency of Communication with Friends  and Family: Once a week   Frequency of Social Gatherings with Friends and Family: Never   Attends Religious Services: Never   Marine scientist or Organizations: No   Attends Music therapist: Never   Marital Status: Married  Human resources officer Violence: Not At Risk   Fear of Current or Ex-Partner: No   Emotionally Abused: No   Physically Abused: No   Sexually Abused: No    Outpatient Medications Prior to Visit  Medication Sig Dispense Refill   albuterol (ACCUNEB) 1.25 MG/3ML nebulizer solution Take 1 ampule by nebulization every 6 (six) hours as needed for wheezing.     albuterol (PROVENTIL HFA;VENTOLIN HFA) 108 (90 Base) MCG/ACT inhaler Inhale 1-2 puffs into the lungs every 6 (six) hours as needed for wheezing or shortness of breath.     Cholecalciferol (VITAMIN D3) 1000 units CAPS Take 1 capsule by mouth  daily.     docusate sodium (COLACE) 100 MG capsule Take 100 mg by mouth 2 (two) times daily.       ferrous sulfate 325 (65 FE) MG tablet Take 1 tablet by mouth 2 (two) times daily.     mometasone (NASONEX) 50 MCG/ACT nasal spray PLACE 2 SPRAYS INTO THE NOSE DAILY. 34 each 2   amLODipine (NORVASC) 10 MG tablet Take 10 mg by mouth daily.     omeprazole (PRILOSEC) 20 MG capsule Take 20 mg by mouth daily.  3   oxyCODONE (OXY IR/ROXICODONE) 5 MG immediate release tablet Take 1 tablet (5 mg total) by mouth every 4 (four) hours as needed for severe pain. 60 tablet 0   pravastatin (PRAVACHOL) 40 MG tablet Take 40 mg by mouth daily.  6   sodium bicarbonate 325 MG tablet Take 1 tablet by mouth 2 (two) times daily.     No facility-administered medications prior to visit.    Allergies  Allergen Reactions   Doxycycline Nausea Only   Penicillins Anaphylaxis    .Has patient had a PCN reaction causing immediate rash, facial/tongue/throat swelling, SOB or lightheadedness with hypotension: Yes Has patient had a PCN reaction causing severe rash involving mucus membranes or skin necrosis: No Has patient had a PCN reaction that required hospitalization: Yes Has patient had a PCN reaction occurring within the last 10 years No If all of the above answers are "NO", then may proceed with Cephalosporin use.    Metronidazole Hives    ROS Review of Systems  Constitutional:  Negative for chills and fever.  HENT:  Negative for congestion and sore throat.   Eyes:  Negative for pain and discharge.  Respiratory:  Negative for cough and shortness of breath.   Cardiovascular:  Negative for chest pain and palpitations.  Gastrointestinal:  Negative for diarrhea, nausea and vomiting.  Endocrine: Negative for polydipsia and polyuria.  Genitourinary:  Negative for dysuria and hematuria.  Musculoskeletal:  Positive for arthralgias and back pain. Negative for neck pain and neck stiffness.  Skin:  Negative for rash.   Neurological:  Negative for dizziness, weakness, numbness and headaches.  Psychiatric/Behavioral:  Positive for agitation. Negative for behavioral problems. The patient is nervous/anxious.      Objective:    Physical Exam Vitals reviewed.  Constitutional:      General: He is not in acute distress.    Appearance: He is not diaphoretic.  HENT:     Head: Normocephalic and atraumatic.     Nose: Nose normal.     Mouth/Throat:     Mouth: Mucous membranes  are moist.  Eyes:     General: No scleral icterus.    Extraocular Movements: Extraocular movements intact.  Cardiovascular:     Rate and Rhythm: Normal rate and regular rhythm.     Pulses: Normal pulses.     Heart sounds: Normal heart sounds. No murmur heard. Pulmonary:     Breath sounds: Normal breath sounds. No wheezing or rales.  Abdominal:     Palpations: Abdomen is soft.     Tenderness: There is no abdominal tenderness.     Comments: Ileostomy tube in place  Musculoskeletal:     Cervical back: Neck supple. No tenderness.     Right lower leg: No edema.     Left lower leg: No edema.  Skin:    General: Skin is warm.     Findings: No rash.  Neurological:     General: No focal deficit present.     Mental Status: He is alert and oriented to person, place, and time.     Sensory: No sensory deficit.     Motor: No weakness.  Psychiatric:        Mood and Affect: Mood normal.        Behavior: Behavior normal.    BP (!) 142/90 (BP Location: Left Arm, Cuff Size: Normal)    Pulse 69    Resp 18    Ht 5' 9"  (1.753 m)    Wt 183 lb 1.3 oz (83 kg)    SpO2 99%    BMI 27.04 kg/m  Wt Readings from Last 3 Encounters:  05/04/21 183 lb 1.3 oz (83 kg)  02/01/21 176 lb (79.8 kg)  11/01/20 177 lb 1.9 oz (80.3 kg)    No results found for: TSH Lab Results  Component Value Date   WBC 8.8 10/03/2017   HGB 17.5 (H) 10/03/2017   HCT 52.3 (H) 10/03/2017   MCV 90.5 10/03/2017   PLT 187 10/03/2017   Lab Results  Component Value Date   NA  138 10/03/2017   K 4.0 10/03/2017   CO2 14 (L) 10/03/2017   GLUCOSE 115 (H) 10/03/2017   BUN 71 (H) 10/03/2017   CREATININE 4.14 (H) 10/03/2017   BILITOT 0.8 10/03/2017   ALKPHOS 94 10/03/2017   AST 20 10/03/2017   ALT 24 10/03/2017   PROT 8.1 10/03/2017   ALBUMIN 3.8 10/03/2017   CALCIUM 9.1 10/03/2017   ANIONGAP 8 10/03/2017   No results found for: CHOL No results found for: HDL No results found for: LDLCALC No results found for: TRIG No results found for: CHOLHDL Lab Results  Component Value Date   HGBA1C 6.0 11/01/2020   HGBA1C 6.0 11/01/2020      Assessment & Plan:   Problem List Items Addressed This Visit       Cardiovascular and Mediastinum   Essential hypertension    BP Readings from Last 1 Encounters:  05/04/21 (!) 142/90  Uncontrolled with Amlodipine, could be elevated due to chronic pain he has he had run out of his pain med Counseled for compliance with the medications Advised DASH diet and moderate exercise/walking as tolerated      Relevant Medications   amLODipine (NORVASC) 10 MG tablet   pravastatin (PRAVACHOL) 40 MG tablet     Respiratory   Chronic obstructive pulmonary disease (HCC)    Uses Albuterol inhaler PRN Not on any maintenance treatment        Digestive   Gastroesophageal reflux disease    Takes Omeprazole, well-controlled  Relevant Medications   omeprazole (PRILOSEC) 20 MG capsule   sodium bicarbonate 325 MG tablet     Genitourinary   ESRD (end stage renal disease) (HCC) - Primary    Last BMP in EMR noted Follows up with Nephrology - has been advised for HD in the past On Vitamin D, iron supplements and sodium bicarbonate Going to see urology as he has history of multiple urologic procedures      Relevant Medications   sodium bicarbonate 325 MG tablet   Other Relevant Orders   CMP14+EGFR     Other   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[place])    Has ileostomy in place, has h/o metastatic bladder cancer, going to see urologist       Chronic pain    Chronic pain from h/o bladder cancer related surgery and ileostomy On Oxycodone - refilled for now Referred to pain management - but did not like the clinic as he had to talk to provider on video only and did not prescribe pain medicine to him      Relevant Medications   oxyCODONE (OXY IR/ROXICODONE) 5 MG immediate release tablet   Other Relevant Orders   ToxASSURE Select 13 (MW), Urine   Other Visit Diagnoses     Mixed hyperlipidemia       Relevant Medications   amLODipine (NORVASC) 10 MG tablet   pravastatin (PRAVACHOL) 40 MG tablet   Prediabetes       Relevant Orders   Hemoglobin A1c   High risk medication use       Relevant Orders   ToxASSURE Select 13 (MW), Urine       Meds ordered this encounter  Medications   amLODipine (NORVASC) 10 MG tablet    Sig: Take 1 tablet (10 mg total) by mouth daily.    Dispense:  90 tablet    Refill:  3   pravastatin (PRAVACHOL) 40 MG tablet    Sig: Take 1 tablet (40 mg total) by mouth daily.    Dispense:  90 tablet    Refill:  3   omeprazole (PRILOSEC) 20 MG capsule    Sig: Take 1 capsule (20 mg total) by mouth daily.    Dispense:  90 capsule    Refill:  3   sodium bicarbonate 325 MG tablet    Sig: Take 1 tablet (325 mg total) by mouth 2 (two) times daily.    Dispense:  180 tablet    Refill:  3   oxyCODONE (OXY IR/ROXICODONE) 5 MG immediate release tablet    Sig: Take 1 tablet (5 mg total) by mouth every 4 (four) hours as needed for severe pain.    Dispense:  60 tablet    Refill:  0    Follow-up: Return in about 3 months (around 08/01/2021) for Chronic pain management.    Lindell Spar, MD

## 2021-08-02 ENCOUNTER — Ambulatory Visit: Payer: Medicare Other | Admitting: Internal Medicine

## 2021-08-02 ENCOUNTER — Encounter: Payer: Self-pay | Admitting: Internal Medicine

## 2021-08-02 VITALS — BP 132/80 | HR 70 | Resp 18 | Ht 69.0 in | Wt 176.4 lb

## 2021-08-02 DIAGNOSIS — G894 Chronic pain syndrome: Secondary | ICD-10-CM

## 2021-08-02 DIAGNOSIS — Z932 Ileostomy status: Secondary | ICD-10-CM

## 2021-08-02 DIAGNOSIS — E875 Hyperkalemia: Secondary | ICD-10-CM

## 2021-08-02 DIAGNOSIS — I10 Essential (primary) hypertension: Secondary | ICD-10-CM | POA: Diagnosis not present

## 2021-08-02 DIAGNOSIS — H6501 Acute serous otitis media, right ear: Secondary | ICD-10-CM

## 2021-08-02 DIAGNOSIS — J449 Chronic obstructive pulmonary disease, unspecified: Secondary | ICD-10-CM

## 2021-08-02 DIAGNOSIS — N186 End stage renal disease: Secondary | ICD-10-CM

## 2021-08-02 MED ORDER — OXYCODONE HCL 5 MG PO TABS: 5.0000 mg | ORAL_TABLET | Freq: Four times a day (QID) | ORAL | 0 refills | Status: DC | PRN

## 2021-08-02 MED ORDER — AZITHROMYCIN 250 MG PO TABS: ORAL_TABLET | ORAL | 0 refills | Status: AC

## 2021-08-02 NOTE — Patient Instructions (Addendum)
Please continue taking medications as prescribed. ? ?Please follow up with your Nephrologist for ESRD. ? ?Please start taking Azithromycin as prescribed for ear infection. If persistent, please contact your ENT specialist. ?

## 2021-08-02 NOTE — Assessment & Plan Note (Signed)
Chronic pain from h/o bladder cancer related surgery and ileostomy On Oxycodone - refilled for now, takes it PRN now instead of daily now Referred to pain management - but did not like the clinic as he had to talk to provider on video only and did not prescribe pain medicine to him 

## 2021-08-02 NOTE — Assessment & Plan Note (Signed)
Uses Albuterol inhaler PRN, well controlled for now ?Not on any maintenance treatment ?

## 2021-08-02 NOTE — Progress Notes (Signed)
? ?Established Patient Office Visit ? ?Subjective:  ?Patient ID: Shane Reeves, male    DOB: 01/12/1946  Age: 76 y.o. MRN: [id number] ? ?CC:  ?Chief Complaint  ?Patient presents with  ? Follow-up  ?  3 month follow up chronic pain management pt right ear has tube but has been hurting down to throat has tried ear drops that helped   ? ? ?HPI ?Shane OHALLORAN is a 76 y.o. 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 ? ?He complains of right ear pain/fullness for last 1 week.  He denies any fever, chills, dyspnea or wheezing.  Denies any ear discharge currently.  He has history of right ear tube placement for middle ear drainage in the past.  He also complains of chronic nasal congestion and postnasal drip. ? ?ESRD: Had been following up with Nephrology, but he has not followed up with them since 11/22. States that he denied HD in the past. He also has h/o frequent nephrolithiasis and UTIs in the past.  He was referred to urology for it, but did not follow up as he states that they can not "cure me". ?  ?Chronic pain: He c/o chronic back pain and knee pain. He had been on Oxycodone for pain, but recently ran out of it. He has been seen at pain clinic, but he could only do video visit with the provider although patient went to their office in [place]. He has been taking it PRN instead of daily now. He requests refill of Oxycodone. ?  ?Ileostomy: Has ileostomy tube in place from surgery for bladder cancer according to him. ? ? ? ? ?Past Medical History:  ?Diagnosis Date  ? Hypertension   ? Renal disorder   ? ? ?Past Surgical History:  ?Procedure Laterality Date  ? BLADDER REMOVAL    ? EYE SURGERY    ? KIDNEY STONE SURGERY    ? ? ?History reviewed. No pertinent family history. ? ?Social History  ? ?Socioeconomic History  ? Marital status: Married  ?  Spouse name:  Shane Reeves  ? Number of children: 3  ? Years of education: 66  ? Highest education level: Not on file  ?Occupational History  ? Not on file  ?Tobacco Use  ? Smoking status: Former  ?  Packs/day: 2.50  ?  Years: 44.00  ?  Pack years: 110.00  ?  Types: Cigarettes  ?  Start date: 1961  ?  Quit date: 2006  ?  Years since quitting: 17.3  ?  Passive exposure: Past  ? Smokeless tobacco: Never  ?Vaping Use  ? Vaping Use: Never used  ?Substance and Sexual Activity  ? Alcohol use: Yes  ?  Alcohol/week: 1.0 standard drink  ?  Types: 1 Cans of beer per week  ?  Comment: occasionally  ? Drug use: No  ? Sexual activity: Yes  ?Other Topics Concern  ? Not on file  ?Social History Narrative  ? Not on file  ? ?Social Determinants of Health  ? ?Financial Resource Strain: Low Risk   ? Difficulty of Paying Living Expenses: Not hard at all  ?Food Insecurity: No Food Insecurity  ? Worried About Charity fundraiser in the Last Year: Never true  ? Ran Out of Food in the Last Year: Never true  ?Transportation Needs: No Transportation Needs  ? Lack of  Transportation (Medical): No  ? Lack of Transportation (Non-Medical): No  ?Physical Activity: Inactive  ? Days of Exercise per Week: 0 days  ? Minutes of Exercise per Session: 0 min  ?Stress: No Stress Concern Present  ? Feeling of Stress : Not at all  ?Social Connections: Socially Isolated  ? Frequency of Communication with Friends and Family: Once a week  ? Frequency of Social Gatherings with Friends and Family: Never  ? Attends Religious Services: Never  ? Active Member of Clubs or Organizations: No  ? Attends Archivist Meetings: Never  ? Marital Status: Married  ?Intimate Partner Violence: Not At Risk  ? Fear of Current or Ex-Partner: No  ? Emotionally Abused: No  ? Physically Abused: No  ? Sexually Abused: No  ? ? ?Outpatient Medications Prior to Visit  ?Medication Sig Dispense Refill  ? albuterol (ACCUNEB) 1.25 MG/3ML nebulizer solution Take 1 ampule by nebulization every 6 (six)  hours as needed for wheezing.    ? albuterol (PROVENTIL HFA;VENTOLIN HFA) 108 (90 Base) MCG/ACT inhaler Inhale 1-2 puffs into the lungs every 6 (six) hours as needed for wheezing or shortness of breath.    ? amLODipine (NORVASC) 10 MG tablet Take 1 tablet (10 mg total) by mouth daily. 90 tablet 3  ? Cholecalciferol (VITAMIN D3) 1000 units CAPS Take 1 capsule by mouth daily.    ? docusate sodium (COLACE) 100 MG capsule Take 100 mg by mouth 2 (two) times daily.      ? ferrous sulfate 325 (65 FE) MG tablet Take 1 tablet by mouth 2 (two) times daily.    ? mometasone (NASONEX) 50 MCG/ACT nasal spray PLACE 2 SPRAYS INTO THE NOSE DAILY. 34 each 2  ? omeprazole (PRILOSEC) 20 MG capsule Take 1 capsule (20 mg total) by mouth daily. 90 capsule 3  ? pravastatin (PRAVACHOL) 40 MG tablet Take 1 tablet (40 mg total) by mouth daily. 90 tablet 3  ? sodium bicarbonate 325 MG tablet Take 1 tablet (325 mg total) by mouth 2 (two) times daily. 180 tablet 3  ? oxyCODONE (OXY IR/ROXICODONE) 5 MG immediate release tablet Take 1 tablet (5 mg total) by mouth every 4 (four) hours as needed for severe pain. 60 tablet 0  ? ?No facility-administered medications prior to visit.  ? ? ?Allergies  ?Allergen Reactions  ? Doxycycline Nausea Only  ? Penicillins Anaphylaxis  ?  Marland KitchenHas patient had a PCN reaction causing immediate rash, facial/tongue/throat swelling, SOB or lightheadedness with hypotension: Yes ?Has patient had a PCN reaction causing severe rash involving mucus membranes or skin necrosis: No ?Has patient had a PCN reaction that required hospitalization: Yes ?Has patient had a PCN reaction occurring within the last 10 years No ?If all of the above answers are "NO", then may proceed with Cephalosporin use. ?  ? Metronidazole Hives  ? ? ?ROS ?Review of Systems  ?Constitutional:  Negative for chills and fever.  ?HENT:  Positive for congestion, ear pain (R ear), postnasal drip and sore throat.   ?Eyes:  Negative for pain and discharge.   ?Respiratory:  Negative for cough and shortness of breath.   ?Cardiovascular:  Negative for chest pain and palpitations.  ?Gastrointestinal:  Negative for diarrhea, nausea and vomiting.  ?Endocrine: Negative for polydipsia and polyuria.  ?Genitourinary:  Negative for dysuria and hematuria.  ?Musculoskeletal:  Positive for arthralgias and back pain. Negative for neck pain and neck stiffness.  ?Skin:  Negative for rash.  ?Neurological:  Negative for dizziness, weakness,  numbness and headaches.  ?Psychiatric/Behavioral:  Positive for agitation. Negative for behavioral problems. The patient is nervous/anxious.   ? ?  ?Objective:  ?  ?Physical Exam ?Vitals reviewed.  ?Constitutional:   ?   General: He is not in acute distress. ?   Appearance: He is not diaphoretic.  ?HENT:  ?   Head: Normocephalic and atraumatic.  ?   Right Ear: There is impacted cerumen.  ?   Left Ear: There is impacted cerumen.  ?   Ears:  ?   Comments: Right external canal erythematous, has middle ear tube in place ?   Nose: Nose normal.  ?   Mouth/Throat:  ?   Mouth: Mucous membranes are moist.  ?Eyes:  ?   General: No scleral icterus. ?   Extraocular Movements: Extraocular movements intact.  ?Cardiovascular:  ?   Rate and Rhythm: Normal rate and regular rhythm.  ?   Pulses: Normal pulses.  ?   Heart sounds: Normal heart sounds. No murmur heard. ?Pulmonary:  ?   Breath sounds: Normal breath sounds. No wheezing or rales.  ?Abdominal:  ?   Palpations: Abdomen is soft.  ?   Tenderness: There is no abdominal tenderness.  ?   Comments: Ileostomy tube in place  ?Musculoskeletal:  ?   Cervical back: Neck supple. No tenderness.  ?   Right lower leg: No edema.  ?   Left lower leg: No edema.  ?Skin: ?   General: Skin is warm.  ?   Findings: No rash.  ?Neurological:  ?   General: No focal deficit present.  ?   Mental Status: He is alert and oriented to person, place, and time.  ?   Sensory: No sensory deficit.  ?   Motor: No weakness.  ?Psychiatric:     ?    Mood and Affect: Mood normal.     ?   Behavior: Behavior normal.  ? ? ?BP 132/80 (BP Location: Right Arm, Patient Position: Sitting, Cuff Size: Normal)   Pulse 70   Resp 18   Ht '5\' 9"'$  (1.753 m)   Wt 176 lb 6.4 oz (80

## 2021-08-02 NOTE — Assessment & Plan Note (Signed)
BP Readings from Last 1 Encounters:  ?08/02/21 132/80  ? ?Well-controlled with Amlodipine ?Counseled for compliance with the medications ?Advised DASH diet and moderate exercise/walking as tolerated ?

## 2021-08-02 NOTE — Assessment & Plan Note (Signed)
Last BMP in EMR noted ?Follows up with Nephrology - has been advised for HD in the past ?On Vitamin D, iron supplements and sodium bicarbonate ?His Nephrologist had referred him to urology as he has history of multiple urologic procedures, but he did not follow up ?Strongly advised to continue follow up with Nephrology ?Check BMP, Ph, PTH and Vit D today ?

## 2021-08-02 NOTE — Assessment & Plan Note (Signed)
Has ileostomy in place, has h/o metastatic bladder cancer, needs to see urologist ?

## 2021-08-02 NOTE — Assessment & Plan Note (Signed)
Started empiric azithromycin ?Has h/o otitis media, had middle ear tube placement for drainage in the past ?If persistent, advised to contact ENT specialist ? ?

## 2021-08-03 LAB — CMP14+EGFR
ALT: 12 IU/L (ref 0–44)
AST: 13 IU/L (ref 0–40)
Albumin/Globulin Ratio: 1.2 (ref 1.2–2.2)
Albumin: 4.3 g/dL (ref 3.7–4.7)
Alkaline Phosphatase: 92 IU/L (ref 44–121)
BUN/Creatinine Ratio: 11 (ref 10–24)
BUN: 43 mg/dL — ABNORMAL HIGH (ref 8–27)
Bilirubin Total: 0.3 mg/dL (ref 0.0–1.2)
CO2: 17 mmol/L — ABNORMAL LOW (ref 20–29)
Calcium: 9.2 mg/dL (ref 8.6–10.2)
Chloride: 108 mmol/L — ABNORMAL HIGH (ref 96–106)
Creatinine, Ser: 4 mg/dL — ABNORMAL HIGH (ref 0.76–1.27)
Globulin, Total: 3.5 g/dL (ref 1.5–4.5)
Glucose: 90 mg/dL (ref 70–99)
Potassium: 5.7 mmol/L — ABNORMAL HIGH (ref 3.5–5.2)
Sodium: 140 mmol/L (ref 134–144)
Total Protein: 7.8 g/dL (ref 6.0–8.5)
eGFR: 15 mL/min/{1.73_m2} — ABNORMAL LOW (ref >59)

## 2021-08-03 LAB — HEMOGLOBIN A1C
Est. average glucose Bld gHb Est-mCnc: 105 mg/dL
Hgb A1c MFr Bld: 5.3 % (ref 4.8–5.6)

## 2021-08-03 MED ORDER — SODIUM POLYSTYRENE SULFONATE PO POWD: ORAL | 0 refills | Status: DC

## 2021-08-03 NOTE — Addendum Note (Signed)
Addended byIhor Dow on: 08/03/2021 07:58 AM ? ? Modules accepted: Orders ? ?

## 2021-08-04 LAB — BASIC METABOLIC PANEL
BUN/Creatinine Ratio: 10 (ref 10–24)
BUN: 40 mg/dL — ABNORMAL HIGH (ref 8–27)
CO2: 18 mmol/L — ABNORMAL LOW (ref 20–29)
Calcium: 9 mg/dL (ref 8.6–10.2)
Chloride: 108 mmol/L — ABNORMAL HIGH (ref 96–106)
Creatinine, Ser: 3.94 mg/dL — ABNORMAL HIGH (ref 0.76–1.27)
Glucose: 91 mg/dL (ref 70–99)
Potassium: 5.2 mmol/L (ref 3.5–5.2)
Sodium: 140 mmol/L (ref 134–144)
eGFR: 15 mL/min/{1.73_m2} — ABNORMAL LOW (ref >59)

## 2021-08-04 LAB — VITAMIN D 25 HYDROXY (VIT D DEFICIENCY, FRACTURES): Vit D, 25-Hydroxy: 48.8 ng/mL (ref 30.0–100.0)

## 2021-08-04 LAB — PHOSPHORUS: Phosphorus: 3.6 mg/dL (ref 2.8–4.1)

## 2021-08-04 LAB — PARATHYROID HORMONE, INTACT (NO CA): PTH: 56 pg/mL (ref 15–65)

## 2021-08-10 LAB — TOXASSURE SELECT 13 (MW), URINE

## 2021-09-06 ENCOUNTER — Telehealth: Payer: Self-pay

## 2021-09-06 ENCOUNTER — Other Ambulatory Visit: Payer: Self-pay | Admitting: *Deleted

## 2021-09-06 DIAGNOSIS — N186 End stage renal disease: Secondary | ICD-10-CM

## 2021-09-06 MED ORDER — SODIUM BICARBONATE 325 MG PO TABS: 325.0000 mg | ORAL_TABLET | Freq: Two times a day (BID) | ORAL | 3 refills | Status: DC

## 2021-09-06 NOTE — Telephone Encounter (Signed)
Patient called need med refill  sodium bicarbonate 325 MG tablet 90 day supply  (180 pills)  Pharmacy  [id number] [place] [id number] Phone:  [phone] Fax:  [phone]

## 2021-09-06 NOTE — Telephone Encounter (Signed)
Medication sent to pharmacy  

## 2021-12-07 ENCOUNTER — Encounter: Payer: Self-pay | Admitting: Internal Medicine

## 2021-12-07 ENCOUNTER — Ambulatory Visit: Payer: Medicare Other | Admitting: Internal Medicine

## 2021-12-07 VITALS — BP 136/78 | HR 83 | Resp 18 | Ht 68.0 in | Wt 178.8 lb

## 2021-12-07 DIAGNOSIS — M51369 Other intervertebral disc degeneration, lumbar region without mention of lumbar back pain or lower extremity pain: Secondary | ICD-10-CM | POA: Insufficient documentation

## 2021-12-07 DIAGNOSIS — N3000 Acute cystitis without hematuria: Secondary | ICD-10-CM

## 2021-12-07 DIAGNOSIS — N186 End stage renal disease: Secondary | ICD-10-CM

## 2021-12-07 DIAGNOSIS — I10 Essential (primary) hypertension: Secondary | ICD-10-CM

## 2021-12-07 DIAGNOSIS — N2589 Other disorders resulting from impaired renal tubular function: Secondary | ICD-10-CM

## 2021-12-07 DIAGNOSIS — Z23 Encounter for immunization: Secondary | ICD-10-CM

## 2021-12-07 DIAGNOSIS — M5136 Other intervertebral disc degeneration, lumbar region: Secondary | ICD-10-CM

## 2021-12-07 DIAGNOSIS — Z87440 Personal history of urinary (tract) infections: Secondary | ICD-10-CM | POA: Diagnosis not present

## 2021-12-07 DIAGNOSIS — G894 Chronic pain syndrome: Secondary | ICD-10-CM | POA: Diagnosis not present

## 2021-12-07 DIAGNOSIS — Z936 Other artificial openings of urinary tract status: Secondary | ICD-10-CM

## 2021-12-07 MED ORDER — OXYCODONE HCL 5 MG PO TABS: 5.0000 mg | ORAL_TABLET | Freq: Four times a day (QID) | ORAL | 0 refills | Status: DC | PRN

## 2021-12-07 NOTE — Assessment & Plan Note (Signed)
Chronic pain from h/o bladder cancer related surgery and ileostomy On Oxycodone - refilled for now, takes it PRN now instead of daily now Referred to pain management - but did not like the clinic as he had to talk to provider on video only and did not prescribe pain medicine to him

## 2021-12-07 NOTE — Assessment & Plan Note (Signed)
Has urostomy in place, has h/o metastatic bladder cancer, needs to see urologist 

## 2021-12-07 NOTE — Assessment & Plan Note (Signed)
BP Readings from Last 1 Encounters:  12/07/21 136/78   Well-controlled with Amlodipine Counseled for compliance with the medications Advised DASH diet and moderate exercise/walking as tolerated

## 2021-12-07 NOTE — Assessment & Plan Note (Signed)
Has dysuria and foul-smelling urine Has urostomy bag with ileal conduit Check UA with reflex culture

## 2021-12-07 NOTE — Assessment & Plan Note (Signed)
Has chronic low back pain Likely due to DDD of lumbar spine Takes oxycodone as needed for severe pain Unable to take oral NSAIDs due to ESRD

## 2021-12-07 NOTE — Patient Instructions (Signed)
Please continue taking medications as prescribed.  Please continue to follow low salt diet and ambulate as tolerated.  Please contact your Nephrologist to schedule appointment for ESRD.

## 2021-12-07 NOTE — Progress Notes (Signed)
Established Patient Office Visit  Subjective:  Patient ID: Shane Reeves, male    DOB: 1945/09/05  Age: 76 y.o. MRN: [id number]  CC:  Chief Complaint  Patient presents with   Follow-up    4 month follow up right leg has been swelling on and off bladder is acting up as well     HPI Shane Reeves is a 76 y.o. 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  ESRD: Had been following up with Nephrology, but he has not followed up with them since 11/22. States that he denied HD in the past. He also has h/o frequent nephrolithiasis and UTIs in the past.  He was referred to urology for it, but did not follow up as he states that they can not "cure me".  Chronic pain: He c/o chronic back pain and knee pain. He had been on Oxycodone for pain, but recently ran out of it. He has been seen at pain clinic, but he could only do video visit with the provider although patient went to their office in [place]. He had been taking it PRN instead of daily, but has run out of it. He requests refill of Oxycodone.  Urostomy: Has urostomy tube in place from surgery for bladder cancer according to him. He agrees to see Urology for it now. He complains of dysuria and foul-smelling urine for the last few weeks.  Denies any fever or chills.  He has history of recurrent UTIs in the past.   Past Medical History:  Diagnosis Date   Hypertension    Renal disorder     Past Surgical History:  Procedure Laterality Date   BLADDER REMOVAL     EYE SURGERY     KIDNEY STONE SURGERY      History reviewed. No pertinent family history.  Social History   Socioeconomic History   Marital status: Married    Spouse name: bettie   Number of children: 3   Years of education: 12   Highest education level: Not on file  Occupational History   Not on file  Tobacco  Use   Smoking status: Former    Packs/day: 2.50    Years: 44.00    Total pack years: 110.00    Types: Cigarettes    Start date: 1961    Quit date: 2006    Years since quitting: 17.6    Passive exposure: Past   Smokeless tobacco: Never  Vaping Use   Vaping Use: Never used  Substance and Sexual Activity   Alcohol use: Yes    Alcohol/week: 1.0 standard drink of alcohol    Types: 1 Cans of beer per week    Comment: occasionally   Drug use: No   Sexual activity: Yes  Other Topics Concern   Not on file  Social History Narrative   Not on file   Social Determinants of Health   Financial Resource Strain: Low Risk  (05/04/2021)   Overall Financial Resource Strain (CARDIA)    Difficulty of Paying Living Expenses: Not hard at all  Food Insecurity: No Food Insecurity (05/04/2021)   Hunger Vital Sign    Worried About Running Out of Food in the Last Year: Never true    Ran Out of Food in the Last Year: Never true  Transportation Needs: No Transportation Needs (05/04/2021)   PRAPARE - Transportation  Lack of Transportation (Medical): No    Lack of Transportation (Non-Medical): No  Physical Activity: Inactive (05/04/2021)   Exercise Vital Sign    Days of Exercise per Week: 0 days    Minutes of Exercise per Session: 0 min  Stress: No Stress Concern Present (05/04/2021)   [place]    Feeling of Stress : Not at all  Social Connections: Socially Isolated (05/04/2021)   Social Connection and Isolation Panel [NHANES]    Frequency of Communication with Friends and Family: Once a week    Frequency of Social Gatherings with Friends and Family: Never    Attends Religious Services: Never    Marine scientist or Organizations: No    Attends Archivist Meetings: Never    Marital Status: Married  Human resources officer Violence: Not At Risk (05/04/2021)   Humiliation, Afraid, Rape, and Kick questionnaire    Fear of Current or  Ex-Partner: No    Emotionally Abused: No    Physically Abused: No    Sexually Abused: No    Outpatient Medications Prior to Visit  Medication Sig Dispense Refill   albuterol (ACCUNEB) 1.25 MG/3ML nebulizer solution Take 1 ampule by nebulization every 6 (six) hours as needed for wheezing.     albuterol (PROVENTIL HFA;VENTOLIN HFA) 108 (90 Base) MCG/ACT inhaler Inhale 1-2 puffs into the lungs every 6 (six) hours as needed for wheezing or shortness of breath.     amLODipine (NORVASC) 10 MG tablet Take 1 tablet (10 mg total) by mouth daily. 90 tablet 3   Cholecalciferol (VITAMIN D3) 1000 units CAPS Take 1 capsule by mouth daily.     docusate sodium (COLACE) 100 MG capsule Take 100 mg by mouth 2 (two) times daily.       ferrous sulfate 325 (65 FE) MG tablet Take 1 tablet by mouth 2 (two) times daily.     mometasone (NASONEX) 50 MCG/ACT nasal spray PLACE 2 SPRAYS INTO THE NOSE DAILY. 34 each 2   omeprazole (PRILOSEC) 20 MG capsule Take 1 capsule (20 mg total) by mouth daily. 90 capsule 3   pravastatin (PRAVACHOL) 40 MG tablet Take 1 tablet (40 mg total) by mouth daily. 90 tablet 3   sodium bicarbonate 325 MG tablet Take 1 tablet (325 mg total) by mouth 2 (two) times daily. 180 tablet 3   sodium polystyrene (KAYEXALATE) powder Take 15 g by mouth once a day for 4 days. 60 g 0   oxyCODONE (OXY IR/ROXICODONE) 5 MG immediate release tablet Take 1 tablet (5 mg total) by mouth every 6 (six) hours as needed for severe pain. 60 tablet 0   No facility-administered medications prior to visit.    Allergies  Allergen Reactions   Doxycycline Nausea Only   Penicillins Anaphylaxis    .Has patient had a PCN reaction causing immediate rash, facial/tongue/throat swelling, SOB or lightheadedness with hypotension: Yes Has patient had a PCN reaction causing severe rash involving mucus membranes or skin necrosis: No Has patient had a PCN reaction that required hospitalization: Yes Has patient had a PCN reaction  occurring within the last 10 years No If all of the above answers are "NO", then may proceed with Cephalosporin use.    Metronidazole Hives    ROS Review of Systems  Constitutional:  Negative for chills and fever.  HENT:  Negative for ear pain, postnasal drip and sore throat.   Eyes:  Negative for pain and discharge.  Respiratory:  Negative  for cough and shortness of breath.   Cardiovascular:  Negative for chest pain and palpitations.  Gastrointestinal:  Negative for diarrhea, nausea and vomiting.  Endocrine: Negative for polydipsia and polyuria.  Genitourinary:  Positive for dysuria. Negative for hematuria.  Musculoskeletal:  Positive for arthralgias and back pain. Negative for neck pain and neck stiffness.  Skin:  Negative for rash.  Neurological:  Negative for dizziness, weakness, numbness and headaches.  Psychiatric/Behavioral:  Positive for agitation. Negative for behavioral problems. The patient is nervous/anxious.       Objective:    Physical Exam Vitals reviewed.  Constitutional:      General: He is not in acute distress.    Appearance: He is not diaphoretic.  HENT:     Head: Normocephalic and atraumatic.     Ears:     Comments: Right external canal erythematous, has middle ear tube in place    Nose: Nose normal.     Mouth/Throat:     Mouth: Mucous membranes are moist.  Eyes:     General: No scleral icterus.    Extraocular Movements: Extraocular movements intact.  Cardiovascular:     Rate and Rhythm: Normal rate and regular rhythm.     Pulses: Normal pulses.     Heart sounds: Normal heart sounds. No murmur heard. Pulmonary:     Breath sounds: Normal breath sounds. No wheezing or rales.  Abdominal:     Palpations: Abdomen is soft.     Tenderness: There is no abdominal tenderness.     Comments: Urostomy tube in place  Musculoskeletal:     Cervical back: Neck supple. No tenderness.     Right lower leg: Edema (Mild) present.     Left lower leg: No edema.   Skin:    General: Skin is warm.     Findings: No rash.  Neurological:     General: No focal deficit present.     Mental Status: He is alert and oriented to person, place, and time.     Sensory: No sensory deficit.     Motor: No weakness.  Psychiatric:        Mood and Affect: Mood normal.        Behavior: Behavior normal.     BP 136/78 (BP Location: Right Arm, Patient Position: Sitting, Cuff Size: Normal)   Pulse 83   Resp 18   Ht 5' 8"  (1.727 m)   Wt 178 lb 12.8 oz (81.1 kg)   SpO2 97%   BMI 27.19 kg/m  Wt Readings from Last 3 Encounters:  12/07/21 178 lb 12.8 oz (81.1 kg)  08/02/21 176 lb 6.4 oz (80 kg)  05/04/21 183 lb 1.3 oz (83 kg)    No results found for: "TSH" Lab Results  Component Value Date   WBC 8.8 10/03/2017   HGB 17.5 (H) 10/03/2017   HCT 52.3 (H) 10/03/2017   MCV 90.5 10/03/2017   PLT 187 10/03/2017   Lab Results  Component Value Date   NA 140 08/02/2021   K 5.7 (H) 08/02/2021   CO2 17 (L) 08/02/2021   GLUCOSE 90 08/02/2021   BUN 43 (H) 08/02/2021   CREATININE 4.00 (H) 08/02/2021   BILITOT 0.3 08/02/2021   ALKPHOS 92 08/02/2021   AST 13 08/02/2021   ALT 12 08/02/2021   PROT 7.8 08/02/2021   ALBUMIN 4.3 08/02/2021   CALCIUM 9.2 08/02/2021   ANIONGAP 8 10/03/2017   EGFR 15 (L) 08/02/2021   No results found for: "CHOL" No results found  for: "HDL" No results found for: "LDLCALC" No results found for: "TRIG" No results found for: "CHOLHDL" Lab Results  Component Value Date   HGBA1C 5.3 08/02/2021      Assessment & Plan:   Problem List Items Addressed This Visit       Cardiovascular and Mediastinum   Essential hypertension    BP Readings from Last 1 Encounters:  12/07/21 136/78  Well-controlled with Amlodipine Counseled for compliance with the medications Advised DASH diet and moderate exercise/walking as tolerated        Musculoskeletal and Integument   DDD (degenerative disc disease), lumbar    Has chronic low back  pain Likely due to DDD of lumbar spine Takes oxycodone as needed for severe pain Unable to take oral NSAIDs due to ESRD      Relevant Medications   oxyCODONE (OXY IR/ROXICODONE) 5 MG immediate release tablet     Genitourinary   ESRD (end stage renal disease) ([place]) - Primary    Last BMP in EMR noted Follows up with Nephrology - has been advised for HD in the past, referred to Dr Theador Hawthorne as he prefers to see a different Nephrologist On Vitamin D, iron supplements and sodium bicarbonate His Nephrologist had referred him to urology as he has history of multiple urologic procedures, but he did not follow up Strongly advised to continue follow up with Nephrology and Urology Check CBC, BMP and PTH      Relevant Orders   Basic Metabolic Panel (BMET)   Parathyroid hormone, intact (no Ca)   CBC with Differential/Platelet   Ambulatory referral to Nephrology   Personal history of urinary (tract) infections    Has dysuria and foul-smelling urine Has urostomy bag with ileal conduit Check UA with reflex culture      Relevant Orders   UA/M w/rflx Culture, Routine   Ambulatory referral to Urology   Hyperkalemic renal tubular acidosis    On Sod bicarb, continue for now Referred to Nephrology      Relevant Orders   Ambulatory referral to Nephrology     Other   Chronic pain    Chronic pain from h/o bladder cancer related surgery and ileostomy On Oxycodone - refilled for now, takes it PRN now instead of daily now Referred to pain management - but did not like the clinic as he had to talk to provider on video only and did not prescribe pain medicine to him      Relevant Medications   oxyCODONE (OXY IR/ROXICODONE) 5 MG immediate release tablet   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    Dispense:  60 tablet    Refill:  0    Follow-up: Return in about 4 months (around 04/08/2022) for Chronic pain and ESRD.    Lindell Spar, MD

## 2021-12-07 NOTE — Assessment & Plan Note (Signed)
Last BMP in EMR noted Follows up with Nephrology - has been advised for HD in the past, referred to Dr Theador Hawthorne as he prefers to see a different Nephrologist On Vitamin D, iron supplements and sodium bicarbonate His Nephrologist had referred him to urology as he has history of multiple urologic procedures, but he did not follow up Strongly advised to continue follow up with Nephrology and Urology Check CBC, BMP and PTH

## 2021-12-07 NOTE — Assessment & Plan Note (Signed)
On Sod bicarb, continue for now Referred to Nephrology

## 2021-12-08 MED ORDER — SULFAMETHOXAZOLE-TRIMETHOPRIM 400-80 MG PO TABS: 1.0000 | ORAL_TABLET | Freq: Two times a day (BID) | ORAL | 0 refills | Status: DC

## 2021-12-08 NOTE — Addendum Note (Signed)
Addended byIhor Dow on: 12/08/2021 08:02 AM   Modules accepted: Orders

## 2021-12-09 LAB — BASIC METABOLIC PANEL
BUN/Creatinine Ratio: 12 (ref 10–24)
BUN: 49 mg/dL — ABNORMAL HIGH (ref 8–27)
CO2: 16 mmol/L — ABNORMAL LOW (ref 20–29)
Calcium: 9 mg/dL (ref 8.6–10.2)
Chloride: 106 mmol/L (ref 96–106)
Creatinine, Ser: 4.02 mg/dL — ABNORMAL HIGH (ref 0.76–1.27)
Glucose: 95 mg/dL (ref 70–99)
Potassium: 5.2 mmol/L (ref 3.5–5.2)
Sodium: 138 mmol/L (ref 134–144)
eGFR: 15 mL/min/{1.73_m2} — ABNORMAL LOW (ref >59)

## 2021-12-09 LAB — CBC WITH DIFFERENTIAL/PLATELET
Basophils Absolute: 0 10*3/uL (ref 0.0–0.2)
Basos: 1 %
EOS (ABSOLUTE): 0 10*3/uL (ref 0.0–0.4)
Eos: 1 %
Hematocrit: 38.9 % (ref 37.5–51.0)
Hemoglobin: 12.5 g/dL — ABNORMAL LOW (ref 13.0–17.7)
Immature Grans (Abs): 0 10*3/uL (ref 0.0–0.1)
Immature Granulocytes: 0 %
Lymphocytes Absolute: 0.7 10*3/uL (ref 0.7–3.1)
Lymphs: 12 %
MCH: 28.2 pg (ref 26.6–33.0)
MCHC: 32.1 g/dL (ref 31.5–35.7)
MCV: 88 fL (ref 79–97)
Monocytes Absolute: 0.7 10*3/uL (ref 0.1–0.9)
Monocytes: 12 %
Neutrophils Absolute: 4.5 10*3/uL (ref 1.4–7.0)
Neutrophils: 74 %
Platelets: 212 10*3/uL (ref 150–450)
RBC: 4.43 x10E6/uL (ref 4.14–5.80)
RDW: 13.2 % (ref 11.6–15.4)
WBC: 6 10*3/uL (ref 3.4–10.8)

## 2021-12-09 LAB — PARATHYROID HORMONE, INTACT (NO CA): PTH: 61 pg/mL (ref 15–65)

## 2021-12-10 LAB — MICROSCOPIC EXAMINATION
RBC, Urine: NONE SEEN /hpf (ref 0–2)
WBC, UA: >30 /hpf — AB (ref 0–5)

## 2021-12-10 LAB — UA/M W/RFLX CULTURE, ROUTINE
Bilirubin, UA: NEGATIVE
Glucose, UA: NEGATIVE
Ketones, UA: NEGATIVE
Nitrite, UA: NEGATIVE
Specific Gravity, UA: 1.012 (ref 1.005–1.030)
Urobilinogen, Ur: 0.2 mg/dL (ref 0.2–1.0)
pH, UA: 8.5 — ABNORMAL HIGH (ref 5.0–7.5)

## 2021-12-10 LAB — URINE CULTURE, REFLEX

## 2021-12-28 ENCOUNTER — Other Ambulatory Visit: Payer: Self-pay | Admitting: Internal Medicine

## 2021-12-28 DIAGNOSIS — J302 Other seasonal allergic rhinitis: Secondary | ICD-10-CM

## 2022-04-05 ENCOUNTER — Encounter: Payer: Self-pay | Admitting: Internal Medicine

## 2022-04-05 ENCOUNTER — Other Ambulatory Visit: Payer: Self-pay | Admitting: Internal Medicine

## 2022-04-05 ENCOUNTER — Ambulatory Visit: Payer: Medicare Other | Admitting: Internal Medicine

## 2022-04-05 VITALS — BP 138/74 | HR 92 | Ht 68.0 in | Wt 172.8 lb

## 2022-04-05 DIAGNOSIS — E782 Mixed hyperlipidemia: Secondary | ICD-10-CM

## 2022-04-05 DIAGNOSIS — N186 End stage renal disease: Secondary | ICD-10-CM

## 2022-04-05 DIAGNOSIS — I10 Essential (primary) hypertension: Secondary | ICD-10-CM

## 2022-04-05 DIAGNOSIS — Z0001 Encounter for general adult medical examination with abnormal findings: Secondary | ICD-10-CM

## 2022-04-05 DIAGNOSIS — Z1159 Encounter for screening for other viral diseases: Secondary | ICD-10-CM

## 2022-04-05 DIAGNOSIS — Z936 Other artificial openings of urinary tract status: Secondary | ICD-10-CM | POA: Diagnosis not present

## 2022-04-05 DIAGNOSIS — Z23 Encounter for immunization: Secondary | ICD-10-CM | POA: Diagnosis not present

## 2022-04-05 DIAGNOSIS — N3000 Acute cystitis without hematuria: Secondary | ICD-10-CM

## 2022-04-05 DIAGNOSIS — I872 Venous insufficiency (chronic) (peripheral): Secondary | ICD-10-CM

## 2022-04-05 MED ORDER — TRIAMCINOLONE ACETONIDE 0.1 % EX CREA: 1.0000 | TOPICAL_CREAM | Freq: Two times a day (BID) | CUTANEOUS | 1 refills | Status: AC

## 2022-04-05 MED ORDER — PRAVASTATIN SODIUM 40 MG PO TABS: 40.0000 mg | ORAL_TABLET | Freq: Every day | ORAL | 3 refills | Status: DC

## 2022-04-05 MED ORDER — AMLODIPINE BESYLATE 10 MG PO TABS: 10.0000 mg | ORAL_TABLET | Freq: Every day | ORAL | 3 refills | Status: DC

## 2022-04-05 NOTE — Progress Notes (Signed)
Established Patient Office Visit  Subjective:  Patient ID: Shane Reeves, male    DOB: 07-03-1945  Age: 77 y.o. MRN: [id number]  CC:  Chief Complaint  Patient presents with   Annual Exam    HPI Shane Reeves is a 77 y.o. male with past medical history of HTN, COPD, GERD, ESRD, malignant bladder cancer s/p resection and ileostomy placement and chronic pain syndrome who presents for annual physical.  ESRD: Had been following up with Nephrology, but he has not followed up with them since 11/22. States that he denied HD in the past. He also has h/o frequent nephrolithiasis and UTIs in the past.  He was referred to urology for it, but did not follow up as he states that they can not "cure me".  Urostomy: Has urostomy tube in place from surgery for bladder cancer according to him. He does not agree to see Urology for it now. He complains of dysuria and foul-smelling urine for the last few weeks.  Denies any fever or chills.  He has history of recurrent UTIs in the past.   Chronic pain: He c/o chronic back pain and knee pain. He had been on Oxycodone for pain, but takes it PRN for severe pain only now.  Past Medical History:  Diagnosis Date   Hypertension    Renal disorder     Past Surgical History:  Procedure Laterality Date   BLADDER REMOVAL     EYE SURGERY     KIDNEY STONE SURGERY      History reviewed. No pertinent family history.  Social History   Socioeconomic History   Marital status: Married    Spouse name: bettie   Number of children: 3   Years of education: 12   Highest education level: Not on file  Occupational History   Not on file  Tobacco Use   Smoking status: Former    Packs/day: 2.50    Years: 44.00    Total pack years: 110.00    Types: Cigarettes    Start date: 1961    Quit date: 2006    Years since quitting: 18.0    Passive exposure: Past   Smokeless tobacco: Never  Vaping Use   Vaping Use: Never used  Substance and Sexual Activity    Alcohol use: Yes    Alcohol/week: 1.0 standard drink of alcohol    Types: 1 Cans of beer per week    Comment: occasionally   Drug use: No   Sexual activity: Yes  Other Topics Concern   Not on file  Social History Narrative   Not on file   Social Determinants of Health   Financial Resource Strain: Low Risk  (05/04/2021)   Overall Financial Resource Strain (CARDIA)    Difficulty of Paying Living Expenses: Not hard at all  Food Insecurity: No Food Insecurity (05/04/2021)   Hunger Vital Sign    Worried About Running Out of Food in the Last Year: Never true    Ran Out of Food in the Last Year: Never true  Transportation Needs: No Transportation Needs (05/04/2021)   PRAPARE - Hydrologist (Medical): No    Lack of Transportation (Non-Medical): No  Physical Activity: Inactive (05/04/2021)   Exercise Vital Sign    Days of Exercise per Week: 0 days    Minutes of Exercise per Session: 0 min  Stress: No Stress Concern Present (05/04/2021)   [place]  Feeling of Stress : Not at all  Social Connections: Socially Isolated (05/04/2021)   Social Connection and Isolation Panel [NHANES]    Frequency of Communication with Friends and Family: Once a week    Frequency of Social Gatherings with Friends and Family: Never    Attends Religious Services: Never    Marine scientist or Organizations: No    Attends Archivist Meetings: Never    Marital Status: Married  Human resources officer Violence: Not At Risk (05/04/2021)   Humiliation, Afraid, Rape, and Kick questionnaire    Fear of Current or Ex-Partner: No    Emotionally Abused: No    Physically Abused: No    Sexually Abused: No    Outpatient Medications Prior to Visit  Medication Sig Dispense Refill   albuterol (ACCUNEB) 1.25 MG/3ML nebulizer solution Take 1 ampule by nebulization every 6 (six) hours as needed for wheezing.     albuterol  (PROVENTIL HFA;VENTOLIN HFA) 108 (90 Base) MCG/ACT inhaler Inhale 1-2 puffs into the lungs every 6 (six) hours as needed for wheezing or shortness of breath.     Cholecalciferol (VITAMIN D3) 1000 units CAPS Take 1 capsule by mouth daily.     docusate sodium (COLACE) 100 MG capsule Take 100 mg by mouth 2 (two) times daily.       ferrous sulfate 325 (65 FE) MG tablet Take 1 tablet by mouth 2 (two) times daily.     mometasone (NASONEX) 50 MCG/ACT nasal spray PLACE 2 SPRAYS INTO THE NOSE DAILY. 17 each 3   omeprazole (PRILOSEC) 20 MG capsule Take 1 capsule (20 mg total) by mouth daily. 90 capsule 3   oxyCODONE (OXY IR/ROXICODONE) 5 MG immediate release tablet Take 1 tablet (5 mg total) by mouth every 6 (six) hours as needed for severe pain. 60 tablet 0   sodium bicarbonate 325 MG tablet TAKE 1 TABLET BY MOUTH TWICE A DAY 200 tablet 3   sodium polystyrene (KAYEXALATE) powder Take 15 g by mouth once a day for 4 days. 60 g 0   sulfamethoxazole-trimethoprim (BACTRIM) 400-80 MG tablet Take 1 tablet by mouth 2 (two) times daily. 10 tablet 0   amLODipine (NORVASC) 10 MG tablet Take 1 tablet (10 mg total) by mouth daily. 90 tablet 3   pravastatin (PRAVACHOL) 40 MG tablet Take 1 tablet (40 mg total) by mouth daily. 90 tablet 3   No facility-administered medications prior to visit.    Allergies  Allergen Reactions   Doxycycline Nausea Only   Penicillins Anaphylaxis    .Has patient had a PCN reaction causing immediate rash, facial/tongue/throat swelling, SOB or lightheadedness with hypotension: Yes Has patient had a PCN reaction causing severe rash involving mucus membranes or skin necrosis: No Has patient had a PCN reaction that required hospitalization: Yes Has patient had a PCN reaction occurring within the last 10 years No If all of the above answers are "NO", then may proceed with Cephalosporin use.    Metronidazole Hives    ROS Review of Systems  Constitutional:  Negative for chills and fever.   HENT:  Negative for ear pain, postnasal drip and sore throat.   Eyes:  Negative for pain and discharge.  Respiratory:  Negative for shortness of breath and wheezing.   Cardiovascular:  Positive for leg swelling. Negative for chest pain and palpitations.  Gastrointestinal:  Negative for diarrhea, nausea and vomiting.  Endocrine: Negative for polydipsia and polyuria.  Genitourinary:  Positive for dysuria. Negative for hematuria.  Musculoskeletal:  Positive  for arthralgias and back pain. Negative for neck pain and neck stiffness.  Skin:  Positive for rash.  Neurological:  Negative for dizziness, weakness, numbness and headaches.  Psychiatric/Behavioral:  Positive for agitation. Negative for behavioral problems. The patient is nervous/anxious.       Objective:    Physical Exam Vitals reviewed.  Constitutional:      General: He is not in acute distress.    Appearance: He is not diaphoretic.  HENT:     Head: Normocephalic and atraumatic.     Ears:     Comments: Right external canal erythematous, has middle ear tube in place    Nose: Nose normal.     Mouth/Throat:     Mouth: Mucous membranes are moist.  Eyes:     General: No scleral icterus.    Extraocular Movements: Extraocular movements intact.  Cardiovascular:     Rate and Rhythm: Normal rate and regular rhythm.     Pulses: Normal pulses.     Heart sounds: Normal heart sounds. No murmur heard. Pulmonary:     Breath sounds: Normal breath sounds. No wheezing or rales.  Abdominal:     Palpations: Abdomen is soft.     Tenderness: There is no abdominal tenderness.     Comments: Urostomy tube in place  Musculoskeletal:     Cervical back: Neck supple. No tenderness.     Right lower leg: Edema (1+) present.     Left lower leg: Edema (Mild) present.  Skin:    General: Skin is warm.     Findings: No rash.  Neurological:     General: No focal deficit present.     Mental Status: He is alert and oriented to person, place, and time.      Sensory: No sensory deficit.     Motor: No weakness.  Psychiatric:        Mood and Affect: Mood normal.        Behavior: Behavior normal.     BP 138/74 (BP Location: Left Arm)   Pulse 92   Ht _0  (1.727 m)   Wt 172 lb 12.8 oz (78.4 kg)   SpO2 95%   BMI 26.27 kg/m  Wt Readings from Last 3 Encounters:  04/05/22 172 lb 12.8 oz (78.4 kg)  12/07/21 178 lb 12.8 oz (81.1 kg)  08/02/21 176 lb 6.4 oz (80 kg)    No results found for: "TSH" Lab Results  Component Value Date   WBC 6.0 12/07/2021   HGB 12.5 (L) 12/07/2021   HCT 38.9 12/07/2021   MCV 88 12/07/2021   PLT 212 12/07/2021   Lab Results  Component Value Date   NA 138 12/07/2021   K 5.2 12/07/2021   CO2 16 (L) 12/07/2021   GLUCOSE 95 12/07/2021   BUN 49 (H) 12/07/2021   CREATININE 4.02 (H) 12/07/2021   BILITOT 0.3 08/02/2021   ALKPHOS 92 08/02/2021   AST 13 08/02/2021   ALT 12 08/02/2021   PROT 7.8 08/02/2021   ALBUMIN 4.3 08/02/2021   CALCIUM 9.0 12/07/2021   ANIONGAP 8 10/03/2017   EGFR 15 (L) 12/07/2021   No results found for: "CHOL" No results found for: "HDL" No results found for: "LDLCALC" No results found for: "TRIG" No results found for: "CHOLHDL" Lab Results  Component Value Date   HGBA1C 5.3 08/02/2021      Assessment & Plan:   Problem List Items Addressed This Visit       Cardiovascular and Mediastinum   Essential  hypertension    BP Readings from Last 1 Encounters:  04/05/22 138/74  Well-controlled with Amlodipine Counseled for compliance with the medications Advised DASH diet and moderate exercise/walking as tolerated      Relevant Medications   amLODipine (NORVASC) 10 MG tablet   pravastatin (PRAVACHOL) 40 MG tablet   Other Relevant Orders   TSH   CMP14+EGFR   CBC with Differential/Platelet     Musculoskeletal and Integument   Venous stasis dermatitis of both lower extremities    Kenalog cream PRN Needs to perform leg elevation      Relevant Medications    triamcinolone cream (KENALOG) 0.1 %     Genitourinary   ESRD (end stage renal disease) (HCC)    Last BMP in EMR noted Does not follow up with Nephrology - has been advised for HD in the past, referred to Dr Theador Hawthorne as he prefers to see a different Nephrologist, but did not see him On Vitamin D, iron supplements and sodium bicarbonate Referred him to urology as he has history of multiple urologic procedures, but he did not follow up Strongly advised to continue follow up with Nephrology and Urology Check CBC, CMP and PTH      Relevant Orders   VITAMIN D 25 Hydroxy (Vit-D Deficiency, Fractures)   CMP14+EGFR   CBC with Differential/Platelet   Parathyroid hormone, intact (no Ca)   Acute cystitis without hematuria    Has had recurrent UTIs due to unresolved urologic issues Has urostomy bag, but does not see Urology for proper care Check UA      Relevant Orders   UA/M w/rflx Culture, Routine     Other   Presence of urostomy ([place])    Has urostomy in place, has h/o metastatic bladder cancer, needs to see urologist      Relevant Orders   UA/M w/rflx Culture, Routine   Mixed hyperlipidemia    On statin, refilled Check lipid profile      Relevant Medications   amLODipine (NORVASC) 10 MG tablet   pravastatin (PRAVACHOL) 40 MG tablet   Other Relevant Orders   Lipid panel   Encounter for general adult medical examination with abnormal findings - Primary    Physical exam as documented. Fasting blood tests ordered. PCV20 vaccine today.      Other Visit Diagnoses     Need for hepatitis C screening test       Relevant Orders   Hepatitis C Antibody   Need for pneumococcal 20-valent conjugate vaccination       Relevant Orders   Pneumococcal conjugate vaccine 20-valent (Completed)       Meds ordered this encounter  Medications   amLODipine (NORVASC) 10 MG tablet    Sig: Take 1 tablet (10 mg total) by mouth daily.    Dispense:  90 tablet    Refill:  3   pravastatin  (PRAVACHOL) 40 MG tablet    Sig: Take 1 tablet (40 mg total) by mouth daily.    Dispense:  90 tablet    Refill:  3   triamcinolone cream (KENALOG) 0.1 %    Sig: Apply 1 Application topically 2 (two) times daily.    Dispense:  30 g    Refill:  1    Follow-up: Return in about 4 months (around 08/04/2022) for ESRD.    Lindell Spar, MD

## 2022-04-05 NOTE — Assessment & Plan Note (Signed)
Physical exam as documented. Fasting blood tests ordered. PCV20 vaccine today.

## 2022-04-05 NOTE — Assessment & Plan Note (Signed)
Kenalog cream PRN Needs to perform leg elevation

## 2022-04-05 NOTE — Assessment & Plan Note (Signed)
On statin, refilled Check lipid profile

## 2022-04-05 NOTE — Assessment & Plan Note (Addendum)
Last BMP in EMR noted Does not follow up with Nephrology - has been advised for HD in the past, referred to Dr Theador Hawthorne as he prefers to see a different Nephrologist, but did not see him On Vitamin D, iron supplements and sodium bicarbonate Referred him to urology as he has history of multiple urologic procedures, but he did not follow up Strongly advised to continue follow up with Nephrology and Urology Check CBC, CMP and PTH

## 2022-04-05 NOTE — Assessment & Plan Note (Signed)
Has had recurrent UTIs due to unresolved urologic issues Has urostomy bag, but does not see Urology for proper care Check UA

## 2022-04-05 NOTE — Patient Instructions (Addendum)
Please apply Kenalog cream over leg rash.  Please continue taking other medications as prescribed.  Please continue to follow low salt diet and ambulate as tolerated.

## 2022-04-05 NOTE — Assessment & Plan Note (Signed)
Has urostomy in place, has h/o metastatic bladder cancer, needs to see urologist

## 2022-04-05 NOTE — Assessment & Plan Note (Signed)
BP Readings from Last 1 Encounters:  04/05/22 138/74   Well-controlled with Amlodipine Counseled for compliance with the medications Advised DASH diet and moderate exercise/walking as tolerated

## 2022-04-07 LAB — HEPATITIS C ANTIBODY: Hep C Virus Ab: NONREACTIVE

## 2022-04-07 LAB — PARATHYROID HORMONE, INTACT (NO CA): PTH: 56 pg/mL (ref 15–65)

## 2022-04-10 ENCOUNTER — Other Ambulatory Visit: Payer: Self-pay | Admitting: Internal Medicine

## 2022-04-10 DIAGNOSIS — N3000 Acute cystitis without hematuria: Secondary | ICD-10-CM

## 2022-04-10 LAB — CBC WITH DIFFERENTIAL/PLATELET
Basophils Absolute: 0 10*3/uL (ref 0.0–0.2)
Basos: 1 %
EOS (ABSOLUTE): 0 10*3/uL (ref 0.0–0.4)
Eos: 1 %
Hematocrit: 37 % — ABNORMAL LOW (ref 37.5–51.0)
Hemoglobin: 11.7 g/dL — ABNORMAL LOW (ref 13.0–17.7)
Immature Grans (Abs): 0 10*3/uL (ref 0.0–0.1)
Immature Granulocytes: 0 %
Lymphocytes Absolute: 1 10*3/uL (ref 0.7–3.1)
Lymphs: 13 %
MCH: 29 pg (ref 26.6–33.0)
MCHC: 31.6 g/dL (ref 31.5–35.7)
MCV: 92 fL (ref 79–97)
Monocytes Absolute: 0.8 10*3/uL (ref 0.1–0.9)
Monocytes: 10 %
Neutrophils Absolute: 5.7 10*3/uL (ref 1.4–7.0)
Neutrophils: 75 %
Platelets: 226 10*3/uL (ref 150–450)
RBC: 4.04 x10E6/uL — ABNORMAL LOW (ref 4.14–5.80)
RDW: 13.1 % (ref 11.6–15.4)
WBC: 7.6 10*3/uL (ref 3.4–10.8)

## 2022-04-10 LAB — CMP14+EGFR
ALT: 7 IU/L (ref 0–44)
AST: 13 IU/L (ref 0–40)
Albumin/Globulin Ratio: 1.2 (ref 1.2–2.2)
Albumin: 4.2 g/dL (ref 3.8–4.8)
Alkaline Phosphatase: 80 IU/L (ref 44–121)
BUN/Creatinine Ratio: 12 (ref 10–24)
BUN: 60 mg/dL — ABNORMAL HIGH (ref 8–27)
Bilirubin Total: 0.4 mg/dL (ref 0.0–1.2)
CO2: 13 mmol/L — ABNORMAL LOW (ref 20–29)
Calcium: 8.8 mg/dL (ref 8.6–10.2)
Chloride: 109 mmol/L — ABNORMAL HIGH (ref 96–106)
Creatinine, Ser: 4.96 mg/dL — ABNORMAL HIGH (ref 0.76–1.27)
Globulin, Total: 3.4 g/dL (ref 1.5–4.5)
Glucose: 94 mg/dL (ref 70–99)
Potassium: 5.3 mmol/L — ABNORMAL HIGH (ref 3.5–5.2)
Sodium: 139 mmol/L (ref 134–144)
Total Protein: 7.6 g/dL (ref 6.0–8.5)
eGFR: 11 mL/min/{1.73_m2} — ABNORMAL LOW (ref >59)

## 2022-04-10 LAB — VITAMIN D 25 HYDROXY (VIT D DEFICIENCY, FRACTURES): Vit D, 25-Hydroxy: 47.3 ng/mL (ref 30.0–100.0)

## 2022-04-10 LAB — UA/M W/RFLX CULTURE, ROUTINE
Bilirubin, UA: NEGATIVE
Glucose, UA: NEGATIVE
Ketones, UA: NEGATIVE
Nitrite, UA: NEGATIVE
Specific Gravity, UA: 1.013 (ref 1.005–1.030)
Urobilinogen, Ur: 0.2 mg/dL (ref 0.2–1.0)
pH, UA: 8 — ABNORMAL HIGH (ref 5.0–7.5)

## 2022-04-10 LAB — TSH: TSH: 1.04 u[IU]/mL (ref 0.450–4.500)

## 2022-04-10 LAB — MICROSCOPIC EXAMINATION
Casts: NONE SEEN /lpf
RBC, Urine: >30 /hpf — AB (ref 0–2)
WBC, UA: >30 /hpf — AB (ref 0–5)

## 2022-04-10 LAB — LIPID PANEL
Chol/HDL Ratio: 3.8 ratio (ref 0.0–5.0)
Cholesterol, Total: 114 mg/dL (ref 100–199)
HDL: 30 mg/dL — ABNORMAL LOW (ref >39)
LDL Chol Calc (NIH): 60 mg/dL (ref 0–99)
Triglycerides: 134 mg/dL (ref 0–149)
VLDL Cholesterol Cal: 24 mg/dL (ref 5–40)

## 2022-04-10 LAB — URINE CULTURE, REFLEX

## 2022-04-10 MED ORDER — SULFAMETHOXAZOLE-TRIMETHOPRIM 400-80 MG PO TABS: 1.0000 | ORAL_TABLET | Freq: Two times a day (BID) | ORAL | 0 refills | Status: DC

## 2022-04-14 ENCOUNTER — Other Ambulatory Visit: Payer: Self-pay

## 2022-04-14 ENCOUNTER — Telehealth: Payer: Self-pay | Admitting: Internal Medicine

## 2022-04-14 DIAGNOSIS — N186 End stage renal disease: Secondary | ICD-10-CM

## 2022-04-14 MED ORDER — SODIUM BICARBONATE 325 MG PO TABS: 325.0000 mg | ORAL_TABLET | Freq: Two times a day (BID) | ORAL | 3 refills | Status: DC

## 2022-04-14 NOTE — Telephone Encounter (Signed)
Refill sent.

## 2022-04-14 NOTE — Telephone Encounter (Signed)
Patient called to give his updated pharmacy going fowarded. [facility] [place]   Need refill patient out of this medicine  sodium bicarbonate 325 MG tablet [[id number]]   Pharmacy: [facility] [place], [place]  Phone # [phone] Fax# [phone]

## 2022-04-26 ENCOUNTER — Other Ambulatory Visit: Payer: Self-pay | Admitting: Internal Medicine

## 2022-04-26 DIAGNOSIS — J302 Other seasonal allergic rhinitis: Secondary | ICD-10-CM

## 2022-05-01 ENCOUNTER — Other Ambulatory Visit: Payer: Self-pay

## 2022-05-01 DIAGNOSIS — K219 Gastro-esophageal reflux disease without esophagitis: Secondary | ICD-10-CM

## 2022-05-01 MED ORDER — OMEPRAZOLE 20 MG PO CPDR: 20.0000 mg | DELAYED_RELEASE_CAPSULE | Freq: Every day | ORAL | 3 refills | Status: DC

## 2022-05-05 ENCOUNTER — Encounter: Payer: Medicare Other | Admitting: Family Medicine

## 2022-05-05 ENCOUNTER — Encounter: Payer: Self-pay | Admitting: Internal Medicine

## 2022-05-10 ENCOUNTER — Other Ambulatory Visit: Payer: Self-pay | Admitting: Internal Medicine

## 2022-05-10 DIAGNOSIS — K219 Gastro-esophageal reflux disease without esophagitis: Secondary | ICD-10-CM

## 2022-05-29 ENCOUNTER — Telehealth: Payer: Self-pay | Admitting: Internal Medicine

## 2022-05-29 NOTE — Telephone Encounter (Signed)
Refills are at the pharmacy

## 2022-05-29 NOTE — Telephone Encounter (Signed)
Prescription Request  05/29/2022  Is this a "Controlled Substance" medicine? No  LOV: 04/05/2022  What is the name of the medication or equipment?  sodium bicarbonate 325 MG tablet    amLODipine (NORVASC) 10 MG tablet   Wants to know if he can get a 90-day qty on both    Have you contacted your pharmacy to request a refill? No   Which pharmacy would you like this sent to?  [id number] [address] [id number]: [phone]: [address] [address]Phone: [phone]: [phone]   Patient notified that their request is being sent to the clinical staff for review and that they should receive a response within 2 business days.   Please advise at [phone]

## 2022-06-07 ENCOUNTER — Ambulatory Visit: Payer: Medicare Other | Admitting: Internal Medicine

## 2022-07-27 ENCOUNTER — Encounter: Payer: Self-pay | Admitting: Internal Medicine

## 2022-07-27 ENCOUNTER — Ambulatory Visit: Payer: Medicare Other | Admitting: Internal Medicine

## 2022-07-27 VITALS — BP 119/63 | HR 87 | Ht 68.0 in | Wt 158.6 lb

## 2022-07-27 DIAGNOSIS — J441 Chronic obstructive pulmonary disease with (acute) exacerbation: Secondary | ICD-10-CM

## 2022-07-27 DIAGNOSIS — Z936 Other artificial openings of urinary tract status: Secondary | ICD-10-CM | POA: Diagnosis not present

## 2022-07-27 DIAGNOSIS — N2589 Other disorders resulting from impaired renal tubular function: Secondary | ICD-10-CM

## 2022-07-27 DIAGNOSIS — Z992 Dependence on renal dialysis: Secondary | ICD-10-CM

## 2022-07-27 DIAGNOSIS — N186 End stage renal disease: Secondary | ICD-10-CM | POA: Diagnosis not present

## 2022-07-27 DIAGNOSIS — I10 Essential (primary) hypertension: Secondary | ICD-10-CM | POA: Diagnosis not present

## 2022-07-27 MED ORDER — ALBUTEROL SULFATE HFA 108 (90 BASE) MCG/ACT IN AERS
1.0000 | INHALATION_SPRAY | Freq: Four times a day (QID) | RESPIRATORY_TRACT | 1 refills | Status: DC | PRN
Start: 2022-07-27 — End: 2023-05-09

## 2022-07-27 MED ORDER — BENZONATATE 200 MG PO CAPS
200.0000 mg | ORAL_CAPSULE | Freq: Two times a day (BID) | ORAL | 0 refills | Status: DC | PRN
Start: 2022-07-27 — End: 2022-11-23

## 2022-07-27 MED ORDER — METHYLPREDNISOLONE 4 MG PO TBPK
ORAL_TABLET | ORAL | 0 refills | Status: DC
Start: 2022-07-27 — End: 2022-11-23

## 2022-07-27 MED ORDER — AZITHROMYCIN 250 MG PO TABS
ORAL_TABLET | ORAL | 0 refills | Status: AC
Start: 2022-07-27 — End: 2022-08-01

## 2022-07-27 NOTE — Assessment & Plan Note (Signed)
Has urostomy in place, has h/o metastatic bladder cancer, needs to see urologist 

## 2022-07-27 NOTE — Assessment & Plan Note (Signed)
On HD MWF Last BMP in EMR noted Does follow up with Nephrology On Vitamin D, iron supplements and sodium bicarbonate Referred him to urology as he has history of multiple urologic procedures Strongly advised to continue follow up with Nephrology and Urology

## 2022-07-27 NOTE — Assessment & Plan Note (Addendum)
Recent worsening of cough and dyspnea likely due to COPD exacerbation Started Z-Pak and Medrol Dosepak Used to have Albuterol inhaler PRN, refilled Not on any maintenance treatment

## 2022-07-27 NOTE — Assessment & Plan Note (Addendum)
BP Readings from Last 1 Encounters:  07/27/22 119/63   Well-controlled with Amlodipine and Torsemide (managed by Nephrology) Has hypotension during HD sessions Advised to avoid Amlodipine on HD days Counseled for compliance with the medications Advised DASH diet and moderate exercise/walking as tolerated

## 2022-07-27 NOTE — Assessment & Plan Note (Signed)
ESRD, on HD MWF Has hypotensive/presyncopal episodes during HD, and advised to avoid amlodipine on HD days

## 2022-07-27 NOTE — Assessment & Plan Note (Signed)
On Sod bicarb, continue for now Followed by Nephrology

## 2022-07-27 NOTE — Progress Notes (Signed)
Established Patient Office Visit  Subjective:  Patient ID: Shane Reeves, male    DOB: 1945/06/15  Age: 77 y.o. MRN: [id number]  CC:  Chief Complaint  Patient presents with   passing out    Patient feels dizzy and passing out while getting dialysis    HPI DRURY ARDIZZONE is a 77 y.o. 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  ESRD: Has been following up with Nephrology.  He has started HD on MWF through right subclavian HD catheter.  He reports dizziness and presyncopal episodes during dialysis.  He has had hypotensive episodes at the end of HD sessions.  He currently takes amlodipine 10 mg daily for HTN.  He also takes torsemide currently. He is also on sodium bicarbonate for renal tubular acidosis.  He also has h/o frequent nephrolithiasis and UTIs in the past.  He was referred to urology for it, but did not follow up as he states that they can not "cure me".  COPD: He reports chronic cough, but with recent worsening of cough with clear expectoration for the last 2 weeks.  Denies any fever or chills.  He also reports dyspnea with minimal exertion.  He used to have albuterol inhaler, but has run out of it.  Chronic pain: He c/o chronic back pain and knee pain. He had been on Oxycodone for pain, but recently ran out of it. He has been seen at pain clinic, but he could only do video visit with the provider although patient went to their office in [place]. He had been taking it PRN instead of daily.  Urostomy: Has urostomy tube in place from surgery for bladder cancer according to him. He has seen [facility] for it now. Denies any fever or chills.  He has history of recurrent UTIs in the past.   Past Medical History:  Diagnosis Date   Hypertension    Renal disorder     Past Surgical History:  Procedure Laterality  Date   BLADDER REMOVAL     EYE SURGERY     KIDNEY STONE SURGERY      History reviewed. No pertinent family history.  Social History   Socioeconomic History   Marital status: Married    Spouse name: bettie   Number of children: 3   Years of education: 12   Highest education level: Not on file  Occupational History   Not on file  Tobacco Use   Smoking status: Former    Packs/day: 2.50    Years: 44.00    Additional pack years: 0.00    Total pack years: 110.00    Types: Cigarettes    Start date: 20    Quit date: 2006    Years since quitting: 18.3    Passive exposure: Past   Smokeless tobacco: Never  Vaping Use   Vaping Use: Never used  Substance and Sexual Activity   Alcohol use: Yes    Alcohol/week: 1.0 standard drink of alcohol    Types: 1 Cans of beer per week    Comment: occasionally   Drug use: No   Sexual activity: Yes  Other Topics Concern   Not on file  Social History Narrative   Not on file   Social Determinants of Health   Financial Resource Strain: Low Risk  (05/04/2021)   Overall Financial Resource Strain (CARDIA)  Difficulty of Paying Living Expenses: Not hard at all  Food Insecurity: No Food Insecurity (05/04/2021)   Hunger Vital Sign    Worried About Running Out of Food in the Last Year: Never true    Ran Out of Food in the Last Year: Never true  Transportation Needs: No Transportation Needs (05/04/2021)   PRAPARE - Administrator, Civil Service (Medical): No    Lack of Transportation (Non-Medical): No  Physical Activity: Inactive (05/04/2021)   Exercise Vital Sign    Days of Exercise per Week: 0 days    Minutes of Exercise per Session: 0 min  Stress: No Stress Concern Present (05/04/2021)   [employer] of Occupational Health - Occupational Stress Questionnaire    Feeling of Stress : Not at all  Social Connections: Socially Isolated (05/04/2021)   Social Connection and Isolation Panel [NHANES]    Frequency of Communication with  Friends and Family: Once a week    Frequency of Social Gatherings with Friends and Family: Never    Attends Religious Services: Never    Database administrator or Organizations: No    Attends Banker Meetings: Never    Marital Status: Married  Catering manager Violence: Not At Risk (05/04/2021)   Humiliation, Afraid, Rape, and Kick questionnaire    Fear of Current or Ex-Partner: No    Emotionally Abused: No    Physically Abused: No    Sexually Abused: No    Outpatient Medications Prior to Visit  Medication Sig Dispense Refill   albuterol (ACCUNEB) 1.25 MG/3ML nebulizer solution Take 1 ampule by nebulization every 6 (six) hours as needed for wheezing.     amLODipine (NORVASC) 10 MG tablet Take 1 tablet (10 mg total) by mouth daily. 90 tablet 3   Cholecalciferol (VITAMIN D3) 1000 units CAPS Take 1 capsule by mouth daily.     docusate sodium (COLACE) 100 MG capsule Take 100 mg by mouth 2 (two) times daily.       ferrous sulfate 325 (65 FE) MG tablet Take 1 tablet by mouth 2 (two) times daily.     mometasone (NASONEX) 50 MCG/ACT nasal spray PLACE 2 SPRAYS INTO THE NOSE DAILY. 51 each 1   omeprazole (PRILOSEC) 20 MG capsule Take 1 capsule (20 mg total) by mouth daily. 90 capsule 3   oxyCODONE (OXY IR/ROXICODONE) 5 MG immediate release tablet Take 1 tablet (5 mg total) by mouth every 6 (six) hours as needed for severe pain. 60 tablet 0   pravastatin (PRAVACHOL) 40 MG tablet Take 1 tablet (40 mg total) by mouth daily. 90 tablet 3   sodium bicarbonate 325 MG tablet Take 1 tablet (325 mg total) by mouth 2 (two) times daily. 200 tablet 3   sodium polystyrene (KAYEXALATE) powder Take 15 g by mouth once a day for 4 days. 60 g 0   triamcinolone cream (KENALOG) 0.1 % Apply 1 Application topically 2 (two) times daily. 30 g 1   albuterol (PROVENTIL HFA;VENTOLIN HFA) 108 (90 Base) MCG/ACT inhaler Inhale 1-2 puffs into the lungs every 6 (six) hours as needed for wheezing or shortness of breath.      sulfamethoxazole-trimethoprim (BACTRIM) 400-80 MG tablet Take 1 tablet by mouth 2 (two) times daily. 10 tablet 0   No facility-administered medications prior to visit.    Allergies  Allergen Reactions   Doxycycline Nausea Only   Penicillins Anaphylaxis    .Has patient had a PCN reaction causing immediate rash, facial/tongue/throat swelling, SOB or lightheadedness  with hypotension: Yes Has patient had a PCN reaction causing severe rash involving mucus membranes or skin necrosis: No Has patient had a PCN reaction that required hospitalization: Yes Has patient had a PCN reaction occurring within the last 10 years No If all of the above answers are "NO", then may proceed with Cephalosporin use.    Metronidazole Hives    ROS Review of Systems  Constitutional:  Negative for chills and fever.  HENT:  Negative for ear pain, postnasal drip and sore throat.   Eyes:  Negative for pain and discharge.  Respiratory:  Positive for cough and shortness of breath.   Cardiovascular:  Negative for chest pain and palpitations.  Gastrointestinal:  Negative for diarrhea, nausea and vomiting.  Endocrine: Negative for polydipsia and polyuria.  Genitourinary:  Positive for dysuria. Negative for hematuria.  Musculoskeletal:  Positive for arthralgias and back pain. Negative for neck pain and neck stiffness.  Skin:  Negative for rash.  Neurological:  Positive for dizziness. Negative for numbness and headaches.  Psychiatric/Behavioral:  Negative for agitation and behavioral problems. The patient is nervous/anxious.       Objective:    Physical Exam Vitals reviewed.  Constitutional:      General: He is not in acute distress.    Appearance: He is not diaphoretic.  HENT:     Head: Normocephalic and atraumatic.     Ears:     Comments: Right external canal erythematous, has middle ear tube in place    Nose: Nose normal.     Mouth/Throat:     Mouth: Mucous membranes are moist.  Eyes:     General:  No scleral icterus.    Extraocular Movements: Extraocular movements intact.  Cardiovascular:     Rate and Rhythm: Normal rate and regular rhythm.     Pulses: Normal pulses.     Heart sounds: Normal heart sounds. No murmur heard. Pulmonary:     Breath sounds: Normal breath sounds. No wheezing or rales.  Abdominal:     Comments: Urostomy tube in place  Musculoskeletal:     Cervical back: Neck supple. No tenderness.     Right lower leg: Edema (Mild) present.     Left lower leg: No edema.  Skin:    General: Skin is warm.     Findings: No rash.  Neurological:     General: No focal deficit present.     Mental Status: He is alert and oriented to person, place, and time.     Sensory: No sensory deficit.     Motor: No weakness.  Psychiatric:        Mood and Affect: Mood normal.        Behavior: Behavior normal.     BP 119/63 (BP Location: Right Arm, Patient Position: Sitting, Cuff Size: Normal)   Pulse 87   Ht  (1.727 m)   Wt 158 lb 9.6 oz (71.9 kg)   SpO2 96%   BMI 24.12 kg/m  Wt Readings from Last 3 Encounters:  07/27/22 158 lb 9.6 oz (71.9 kg)  04/05/22 172 lb 12.8 oz (78.4 kg)  12/07/21 178 lb 12.8 oz (81.1 kg)    Lab Results  Component Value Date   TSH 1.040 04/05/2022   Lab Results  Component Value Date   WBC 7.6 04/05/2022   HGB 11.7 (L) 04/05/2022   HCT 37.0 (L) 04/05/2022   MCV 92 04/05/2022   PLT 226 04/05/2022   Lab Results  Component Value Date   NA  139 04/05/2022   K 5.3 (H) 04/05/2022   CO2 13 (L) 04/05/2022   GLUCOSE 94 04/05/2022   BUN 60 (H) 04/05/2022   CREATININE 4.96 (H) 04/05/2022   BILITOT 0.4 04/05/2022   ALKPHOS 80 04/05/2022   AST 13 04/05/2022   ALT 7 04/05/2022   PROT 7.6 04/05/2022   ALBUMIN 4.2 04/05/2022   CALCIUM 8.8 04/05/2022   ANIONGAP 8 10/03/2017   EGFR 11 (L) 04/05/2022   Lab Results  Component Value Date   CHOL 114 04/05/2022   Lab Results  Component Value Date   HDL 30 (L) 04/05/2022   Lab Results   Component Value Date   LDLCALC 60 04/05/2022   Lab Results  Component Value Date   TRIG 134 04/05/2022   Lab Results  Component Value Date   CHOLHDL 3.8 04/05/2022   Lab Results  Component Value Date   HGBA1C 5.3 08/02/2021      Assessment & Plan:   Problem List Items Addressed This Visit       Cardiovascular and Mediastinum   Essential hypertension    BP Readings from Last 1 Encounters:  07/27/22 119/63  Well-controlled with Amlodipine and Torsemide (managed by Nephrology) Has hypotension during HD sessions Advised to avoid Amlodipine on HD days Counseled for compliance with the medications Advised DASH diet and moderate exercise/walking as tolerated        Respiratory   Chronic obstructive pulmonary disease (HCC)    Recent worsening of cough and dyspnea likely due to COPD exacerbation Started Z-Pak and Medrol Dosepak Used to have Albuterol inhaler PRN, refilled Not on any maintenance treatment      Relevant Medications   azithromycin (ZITHROMAX) 250 MG tablet   albuterol (VENTOLIN HFA) 108 (90 Base) MCG/ACT inhaler   methylPREDNISolone (MEDROL DOSEPAK) 4 MG TBPK tablet   benzonatate (TESSALON) 200 MG capsule     Genitourinary   ESRD (end stage renal disease) (HCC) - Primary    On HD MWF Last BMP in EMR noted Does follow up with Nephrology On Vitamin D, iron supplements and sodium bicarbonate Referred him to urology as he has history of multiple urologic procedures Strongly advised to continue follow up with Nephrology and Urology      Hyperkalemic renal tubular acidosis    On Sod bicarb, continue for now Followed by Nephrology        Other   Presence of urostomy (HCC)    Has urostomy in place, has h/o metastatic bladder cancer, needs to see urologist      Dependence on renal dialysis (HCC)    ESRD, on HD MWF Has hypotensive/presyncopal episodes during HD, and advised to avoid amlodipine on HD days       Meds ordered this encounter   Medications   azithromycin (ZITHROMAX) 250 MG tablet    Sig: Take 2 tablets on day 1, then 1 tablet daily on days 2 through 5    Dispense:  6 tablet    Refill:  0   albuterol (VENTOLIN HFA) 108 (90 Base) MCG/ACT inhaler    Sig: Inhale 1-2 puffs into the lungs every 6 (six) hours as needed for wheezing or shortness of breath.    Dispense:  18 g    Refill:  1   methylPREDNISolone (MEDROL DOSEPAK) 4 MG TBPK tablet    Sig: Take as package instructions.    Dispense:  1 each    Refill:  0   benzonatate (TESSALON) 200 MG capsule    Sig: Take  1 capsule (200 mg total) by mouth 2 (two) times daily as needed for cough.    Dispense:  20 capsule    Refill:  0    Follow-up: Return in about 4 months (around 11/26/2022) for HTN and ESRD.    Anabel Halon, MD

## 2022-07-27 NOTE — Assessment & Plan Note (Deleted)
Has ileostomy in place, has h/o metastatic bladder cancer, needs to see urologist ?

## 2022-07-27 NOTE — Patient Instructions (Signed)
Please take Azithromycin and Prednisone as prescribed.  Please take Tessalon as needed for cough.  Please use Albuterol inhaler as needed for shortness of breath or wheezing.  Please take Amlodipine every other day on non-dialysis days.

## 2022-08-07 ENCOUNTER — Ambulatory Visit: Payer: Medicare Other | Admitting: Internal Medicine

## 2022-08-10 ENCOUNTER — Ambulatory Visit: Payer: Medicare Other | Admitting: Internal Medicine

## 2022-11-06 ENCOUNTER — Telehealth: Payer: Self-pay | Admitting: Internal Medicine

## 2022-11-06 ENCOUNTER — Other Ambulatory Visit: Payer: Self-pay

## 2022-11-06 MED ORDER — FUROSEMIDE 40 MG PO TABS: 40.0000 mg | ORAL_TABLET | Freq: Every day | ORAL | 0 refills | Status: AC

## 2022-11-06 NOTE — Telephone Encounter (Signed)
Patient called asked why was this medicine Furosemide 40 mg denied?

## 2022-11-06 NOTE — Telephone Encounter (Signed)
Medication was not denied, medication request was never sent to [place]. Refills sent to pharmacy

## 2022-11-23 ENCOUNTER — Ambulatory Visit: Payer: Medicare Other | Admitting: Internal Medicine

## 2022-11-23 ENCOUNTER — Encounter: Payer: Self-pay | Admitting: Internal Medicine

## 2022-11-23 VITALS — BP 116/70 | HR 89 | Wt 161.0 lb

## 2022-11-23 DIAGNOSIS — Z992 Dependence on renal dialysis: Secondary | ICD-10-CM | POA: Diagnosis not present

## 2022-11-23 DIAGNOSIS — I10 Essential (primary) hypertension: Secondary | ICD-10-CM

## 2022-11-23 DIAGNOSIS — N2581 Secondary hyperparathyroidism of renal origin: Secondary | ICD-10-CM

## 2022-11-23 DIAGNOSIS — N186 End stage renal disease: Secondary | ICD-10-CM | POA: Diagnosis not present

## 2022-11-23 DIAGNOSIS — Z936 Other artificial openings of urinary tract status: Secondary | ICD-10-CM

## 2022-11-23 DIAGNOSIS — R197 Diarrhea, unspecified: Secondary | ICD-10-CM

## 2022-11-23 NOTE — Progress Notes (Signed)
Established Patient Office Visit  Subjective:  Patient ID: Shane Reeves, male    DOB: 01/06/1946  Age: 77 y.o. MRN: [id number]  CC:  Chief Complaint  Patient presents with   Hypertension    4 month fu    ESRD    4 month fu    Diarrhea    Diarrhea episodes every evenings.     HPI LEORN FAIST is a 77 y.o. 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  ESRD: Has been following up with Nephrology.  He has started HD on MWF through right subclavian HD catheter.  He reports dizziness and presyncopal episodes during dialysis.  He had hypotensive episodes at the end of HD sessions and was advised to stop amlodipine 10 mg daily for HTN.  He still takes furosemide currently.  He is being considered for peritoneal dialysis.  He also has h/o frequent nephrolithiasis and UTIs in the past.  He was referred to urology for it, but did not follow up as he states that they can not "cure me".  COPD: He reports chronic cough, but with recent worsening of cough with clear expectoration for the last 2 weeks.  Denies any fever or chills.  He also reports dyspnea with minimal exertion.  He used to have albuterol inhaler, but has run out of it.  Urostomy: Has urostomy tube in place from surgery for bladder cancer according to him. He has seen [facility] for it now. Denies any fever or chills.  He has history of recurrent UTIs in the past.  Diarrhea: He reports chronic diarrhea for the last 2 months.  Denies any melena or hematochezia.  He has loose to watery BM, about 2-3 times in a day.  Denies any nausea, vomiting, fever or chills.  Denies any recent travel.  He has tried taking Imodium, but has recurrent diarrhea.  Has been history of recurrent UTIs and has been exposed to antibiotics multiple times.  Past Medical History:  Diagnosis  Date   Hypertension    Renal disorder     Past Surgical History:  Procedure Laterality Date   BLADDER REMOVAL     EYE SURGERY     KIDNEY STONE SURGERY      History reviewed. No pertinent family history.  Social History   Socioeconomic History   Marital status: Married    Spouse name: Shane Reeves   Number of children: 3   Years of education: 12   Highest education level: Not on file  Occupational History   Not on file  Tobacco Use   Smoking status: Former    Current packs/day: 0.00    Average packs/day: 2.5 packs/day for 45.0 years (112.5 ttl pk-yrs)    Types: Cigarettes    Start date: 66    Quit date: 2006    Years since quitting: 18.6    Passive exposure: Past   Smokeless tobacco: Never  Vaping Use   Vaping status: Never Used  Substance and Sexual Activity   Alcohol use: Yes    Alcohol/week: 1.0 standard drink of alcohol    Types: 1 Cans of beer per week    Comment: occasionally   Drug use: No   Sexual activity: Yes  Other Topics Concern   Not on file  Social History Narrative   Not on file   Social Determinants of Health  Financial Resource Strain: Low Risk  (05/04/2021)   Overall Financial Resource Strain (CARDIA)    Difficulty of Paying Living Expenses: Not hard at all  Food Insecurity: No Food Insecurity (05/04/2021)   Hunger Vital Sign    Worried About Running Out of Food in the Last Year: Never true    Ran Out of Food in the Last Year: Never true  Transportation Needs: No Transportation Needs (05/04/2021)   PRAPARE - Administrator, Civil Service (Medical): No    Lack of Transportation (Non-Medical): No  Physical Activity: Inactive (05/04/2021)   Exercise Vital Sign    Days of Exercise per Week: 0 days    Minutes of Exercise per Session: 0 min  Stress: No Stress Concern Present (05/04/2021)   [employer] of Occupational Health - Occupational Stress Questionnaire    Feeling of Stress : Not at all  Social Connections: Socially Isolated  (05/04/2021)   Social Connection and Isolation Panel [NHANES]    Frequency of Communication with Friends and Family: Once a week    Frequency of Social Gatherings with Friends and Family: Never    Attends Religious Services: Never    Database administrator or Organizations: No    Attends Banker Meetings: Never    Marital Status: Married  Catering manager Violence: Not At Risk (05/04/2021)   Humiliation, Afraid, Rape, and Kick questionnaire    Fear of Current or Ex-Partner: No    Emotionally Abused: No    Physically Abused: No    Sexually Abused: No    Outpatient Medications Prior to Visit  Medication Sig Dispense Refill   Cholecalciferol (VITAMIN D3) 1000 units CAPS Take 1 capsule by mouth daily.     triamcinolone cream (KENALOG) 0.1 % Apply 1 Application topically 2 (two) times daily. 30 g 1   docusate sodium (COLACE) 100 MG capsule Take 100 mg by mouth 2 (two) times daily.       albuterol (ACCUNEB) 1.25 MG/3ML nebulizer solution Take 1 ampule by nebulization every 6 (six) hours as needed for wheezing. (Patient not taking: Reported on 11/23/2022)     albuterol (VENTOLIN HFA) 108 (90 Base) MCG/ACT inhaler Inhale 1-2 puffs into the lungs every 6 (six) hours as needed for wheezing or shortness of breath. (Patient not taking: Reported on 11/23/2022) 18 g 1   ferrous sulfate 325 (65 FE) MG tablet Take 1 tablet by mouth 2 (two) times daily.     furosemide (LASIX) 40 MG tablet Take 1 tablet (40 mg total) by mouth daily. (Patient not taking: Reported on 11/23/2022) 90 tablet 0   mometasone (NASONEX) 50 MCG/ACT nasal spray PLACE 2 SPRAYS INTO THE NOSE DAILY. 51 each 1   omeprazole (PRILOSEC) 20 MG capsule Take 1 capsule (20 mg total) by mouth daily. 90 capsule 3   pravastatin (PRAVACHOL) 40 MG tablet Take 1 tablet (40 mg total) by mouth daily. 90 tablet 3   amLODipine (NORVASC) 10 MG tablet Take 1 tablet (10 mg total) by mouth daily. (Patient not taking: Reported on 11/23/2022) 90 tablet 3    benzonatate (TESSALON) 200 MG capsule Take 1 capsule (200 mg total) by mouth 2 (two) times daily as needed for cough. (Patient not taking: Reported on 11/23/2022) 20 capsule 0   methylPREDNISolone (MEDROL DOSEPAK) 4 MG TBPK tablet Take as package instructions. 1 each 0   oxyCODONE (OXY IR/ROXICODONE) 5 MG immediate release tablet Take 1 tablet (5 mg total) by mouth every 6 (six) hours as needed  for severe pain. (Patient not taking: Reported on 11/23/2022) 60 tablet 0   sodium bicarbonate 325 MG tablet Take 1 tablet (325 mg total) by mouth 2 (two) times daily. (Patient not taking: Reported on 11/23/2022) 200 tablet 3   sodium polystyrene (KAYEXALATE) powder Take 15 g by mouth once a day for 4 days. (Patient not taking: Reported on 11/23/2022) 60 g 0   No facility-administered medications prior to visit.    Allergies  Allergen Reactions   Doxycycline Nausea Only   Penicillins Anaphylaxis    .Has patient had a PCN reaction causing immediate rash, facial/tongue/throat swelling, SOB or lightheadedness with hypotension: Yes Has patient had a PCN reaction causing severe rash involving mucus membranes or skin necrosis: No Has patient had a PCN reaction that required hospitalization: Yes Has patient had a PCN reaction occurring within the last 10 years No If all of the above answers are "NO", then may proceed with Cephalosporin use.    Metronidazole Hives    ROS Review of Systems  Constitutional:  Negative for chills and fever.  HENT:  Negative for ear pain, postnasal drip and sore throat.   Eyes:  Negative for pain and discharge.  Respiratory:  Positive for cough and shortness of breath (Chronic).   Cardiovascular:  Negative for chest pain and palpitations.  Gastrointestinal:  Positive for diarrhea. Negative for nausea and vomiting.  Endocrine: Negative for polydipsia and polyuria.  Genitourinary:  Negative for dysuria and hematuria.  Musculoskeletal:  Positive for arthralgias and back pain.  Negative for neck pain and neck stiffness.  Skin:  Negative for rash.  Neurological:  Negative for numbness and headaches.  Psychiatric/Behavioral:  Negative for agitation and behavioral problems. The patient is nervous/anxious.       Objective:    Physical Exam Vitals reviewed.  Constitutional:      General: He is not in acute distress.    Appearance: He is not diaphoretic.  HENT:     Head: Normocephalic and atraumatic.     Ears:     Comments: Has middle ear tube in place    Nose: Nose normal.     Mouth/Throat:     Mouth: Mucous membranes are moist.  Eyes:     General: No scleral icterus.    Extraocular Movements: Extraocular movements intact.  Cardiovascular:     Rate and Rhythm: Normal rate and regular rhythm.     Pulses: Normal pulses.     Heart sounds: Normal heart sounds. No murmur heard. Pulmonary:     Breath sounds: Normal breath sounds. No wheezing or rales.  Abdominal:     Palpations: Abdomen is soft.     Tenderness: There is no abdominal tenderness.     Comments: Urostomy tube in place  Musculoskeletal:     Cervical back: Neck supple. No tenderness.     Right lower leg: Edema (Mild) present.     Left lower leg: No edema.  Skin:    General: Skin is warm.     Findings: No rash.  Neurological:     General: No focal deficit present.     Mental Status: He is alert and oriented to person, place, and time.     Sensory: No sensory deficit.     Motor: No weakness.  Psychiatric:        Mood and Affect: Mood normal.        Behavior: Behavior normal.     BP 116/70 (BP Location: Right Arm, Patient Position: Sitting, Cuff Size: Small)  Pulse 89   Wt 161 lb (73 kg)   SpO2 99%   BMI 24.48 kg/m  Wt Readings from Last 3 Encounters:  11/23/22 161 lb (73 kg)  07/27/22 158 lb 9.6 oz (71.9 kg)  04/05/22 172 lb 12.8 oz (78.4 kg)    Lab Results  Component Value Date   TSH 1.040 04/05/2022   Lab Results  Component Value Date   WBC 7.6 04/05/2022   HGB 11.7  (L) 04/05/2022   HCT 37.0 (L) 04/05/2022   MCV 92 04/05/2022   PLT 226 04/05/2022   Lab Results  Component Value Date   NA 139 04/05/2022   K 5.3 (H) 04/05/2022   CO2 13 (L) 04/05/2022   GLUCOSE 94 04/05/2022   BUN 60 (H) 04/05/2022   CREATININE 4.96 (H) 04/05/2022   BILITOT 0.4 04/05/2022   ALKPHOS 80 04/05/2022   AST 13 04/05/2022   ALT 7 04/05/2022   PROT 7.6 04/05/2022   ALBUMIN 4.2 04/05/2022   CALCIUM 8.8 04/05/2022   ANIONGAP 8 10/03/2017   EGFR 11 (L) 04/05/2022   Lab Results  Component Value Date   CHOL 114 04/05/2022   Lab Results  Component Value Date   HDL 30 (L) 04/05/2022   Lab Results  Component Value Date   LDLCALC 60 04/05/2022   Lab Results  Component Value Date   TRIG 134 04/05/2022   Lab Results  Component Value Date   CHOLHDL 3.8 04/05/2022   Lab Results  Component Value Date   HGBA1C 5.3 08/02/2021      Assessment & Plan:   Problem List Items Addressed This Visit       Cardiovascular and Mediastinum   Essential hypertension - Primary    BP Readings from Last 1 Encounters:  11/23/22 116/70   Well-controlled, was on Amlodipine and Furosemide (managed by Nephrology) Had hypotension during HD sessions, DC Amlodipine Counseled for compliance with the medications Advised DASH diet and moderate exercise/walking as tolerated        Endocrine   Secondary hyperparathyroidism (HCC)    Due to ESRD On HD now        Genitourinary   ESRD (end stage renal disease) (HCC)    On HD MWF Last BMP in EMR noted Does follow up with Nephrology On Vitamin D, iron supplements and sodium bicarbonate Referred him to urology as he has history of multiple urologic procedures Strongly advised to continue follow up with Nephrology and Urology        Other   Presence of urostomy [facility])    Has urostomy in place with ileal conduit, has h/o metastatic bladder cancer, needs to see urologist      Dependence on renal dialysis (HCC)    ESRD, on HD  MWF Has hypotensive/presyncopal episodes during HD, and advised to avoid amlodipine on HD days He is being considered for peritoneal dialysis      Diarrhea    Could be due to an infectious etiology Check stool GI profile If negative for acute infection, can take Imodium as needed      Relevant Orders   GI Profile, Stool, PCR     No orders of the defined types were placed in this encounter.   Follow-up: Return in about 5 months (around 04/25/2023) for Annual physical.    Anabel Halon, MD

## 2022-11-23 NOTE — Assessment & Plan Note (Deleted)
Has ileostomy in place, has h/o metastatic bladder cancer, needs to see urologist ?

## 2022-11-23 NOTE — Assessment & Plan Note (Signed)
Could be due to an infectious etiology Check stool GI profile If negative for acute infection, can take Imodium as needed

## 2022-11-23 NOTE — Assessment & Plan Note (Signed)
Due to ESRD On HD now

## 2022-11-23 NOTE — Assessment & Plan Note (Addendum)
ESRD, on HD MWF Has hypotensive/presyncopal episodes during HD, and advised to avoid amlodipine on HD days He is being considered for peritoneal dialysis

## 2022-11-23 NOTE — Assessment & Plan Note (Signed)
Has urostomy in place with ileal conduit, has h/o metastatic bladder cancer, needs to see urologist

## 2022-11-23 NOTE — Assessment & Plan Note (Signed)
On HD MWF Last BMP in EMR noted Does follow up with Nephrology On Vitamin D, iron supplements and sodium bicarbonate Referred him to urology as he has history of multiple urologic procedures Strongly advised to continue follow up with Nephrology and Urology

## 2022-11-23 NOTE — Patient Instructions (Addendum)
Please get stool test done as instructed.  Please continue to take medications as prescribed.  Please continue to follow low salt diet and ambulate as tolerated.  Okay to take GasX or Simethicone as needed for bloating.

## 2022-11-23 NOTE — Assessment & Plan Note (Addendum)
BP Readings from Last 1 Encounters:  11/23/22 116/70   Well-controlled, was on Amlodipine and Furosemide (managed by Nephrology) Had hypotension during HD sessions, DC Amlodipine Counseled for compliance with the medications Advised DASH diet and moderate exercise/walking as tolerated

## 2022-12-19 ENCOUNTER — Ambulatory Visit (INDEPENDENT_AMBULATORY_CARE_PROVIDER_SITE_OTHER): Payer: Medicare Other

## 2022-12-19 VITALS — Ht 69.0 in | Wt 155.0 lb

## 2022-12-19 DIAGNOSIS — Z0000 Encounter for general adult medical examination without abnormal findings: Secondary | ICD-10-CM

## 2022-12-19 NOTE — Patient Instructions (Addendum)
Shane Reeves , Thank you for taking time to come for your Medicare Wellness Visit. I appreciate your ongoing commitment to your health goals. Please review the following plan we discussed and let me know if I can assist you in the future.   Referrals/Orders/Follow-Ups/Clinician Recommendations:  You are due for the vaccines checked below. You may have these done at your preferred pharmacy. Please have them fax the office proof of the vaccines so that we can update your chart.   [x]  Flu (due annually)  Recommended this fall either at PCP office or through your local pharmacy. The flu season starts August 1 of each year.   [x]  Shingrix (Shingles vaccine): CDC recommends 2 doses of Shingrix separated by 2-6 months for aged 48 years and older:  []  Pneumonia Vaccines: Recommended for adults 65 years or older  [x]  TDAP (Tetanus) Vaccine every 10 years:Recommended every 10 years; Please call your insurance company to determine your out of pocket expense. You also receive this vaccine at your local pharmacy or Health Dept.  [x]  Covid-19: Available now at any [facility] pharmacy (see info below)  You may also get your vaccines at any [facility] (locations listed below.) Vaccine hours are Monday - Friday 9:00 - 4:00. No appointments are required. Most insurances are accepted including Medicaid. Anyone can use the community pharmacies, and people are not required to have a [facility] provider.  Community Pharmacy Locations offering vaccines:   Sport and exercise psychologist   [facility] [place] [facility]  10 vaccines are offered at the [employer]: Covid, flu, Tdap, shingles, RSV, pneumonia, meningococcal, hepatitis A, hepatitis B, and HPV.   Next Annual Wellness Visit Appointment: Your next Medicare AWV will be on March 05, 2024 at 9:20am. This will be a virtual visit so we will do it via video. If you  are unable to do the visit through video please let our office know.   This is a list of the screening recommended for you and due dates:  Health Maintenance  Topic Date Due   DTaP/Tdap/Td vaccine (1 - Tdap) Never done   Zoster (Shingles) Vaccine (1 of 2) Never done   Flu Shot  11/02/2022   COVID-19 Vaccine (5 - 2023-24 season) 12/03/2022   Medicare Annual Wellness Visit  12/19/2023   Pneumonia Vaccine  Completed   Hepatitis C Screening  Completed   HPV Vaccine  Aged Out    Advanced directives: (ACP Link)Information on Advanced Care Planning can be found at [facility] [place] Advance Health Care Directives Advance Health Care Directives ([email or web address]   Next Medicare Annual Wellness Visit scheduled for next year: Yes  Preventive Care 65 Years and Older, Male Preventive care refers to lifestyle choices and visits with your health care provider that can promote health and wellness. Preventive care visits are also called wellness exams. What can I expect for my preventive care visit? Counseling During your preventive care visit, your health care provider may ask about your: Medical history, including: Past medical problems. Family medical history. History of falls. Current health, including: Emotional well-being. Home life and relationship well-being. Sexual activity. Memory and ability to understand (cognition). Lifestyle, including: Alcohol, nicotine or tobacco, and drug use. Access to firearms. Diet, exercise, and sleep habits. Work and work Astronomer. Sunscreen use. Safety issues such as seatbelt and bike helmet use. Physical exam Your health care provider will check your: Height and  weight. These may be used to calculate your BMI (body mass index). BMI is a measurement that tells if you are at a healthy weight. Waist circumference. This measures the distance around your waistline. This measurement also tells if you are at a healthy weight and may help  predict your risk of certain diseases, such as type 2 diabetes and high blood pressure. Heart rate and blood pressure. Body temperature. Skin for abnormal spots. What immunizations do I need?  Vaccines are usually given at various ages, according to a schedule. Your health care provider will recommend vaccines for you based on your age, medical history, and lifestyle or other factors, such as travel or where you work. What tests do I need? Screening Your health care provider may recommend screening tests for certain conditions. This may include: Lipid and cholesterol levels. Diabetes screening. This is done by checking your blood sugar (glucose) after you have not eaten for a while (fasting). Hepatitis C test. Hepatitis B test. HIV (human immunodeficiency virus) test. STI (sexually transmitted infection) testing, if you are at risk. Lung cancer screening. Colorectal cancer screening. Prostate cancer screening. Abdominal aortic aneurysm (AAA) screening. You may need this if you are a current or former smoker. Talk with your health care provider about your test results, treatment options, and if necessary, the need for more tests. Follow these instructions at home: Eating and drinking  Eat a diet that includes fresh fruits and vegetables, whole grains, lean protein, and low-fat dairy products. Limit your intake of foods with high amounts of sugar, saturated fats, and salt. Take vitamin and mineral supplements as recommended by your health care provider. Do not drink alcohol if your health care provider tells you not to drink. If you drink alcohol: Limit how much you have to 0-2 drinks a day. Know how much alcohol is in your drink. In the [place] one drink equals one 12 oz bottle of beer (355 mL), one 5 oz glass of wine (148 mL), or one 1 oz glass of hard liquor (44 mL). Lifestyle Brush your teeth every morning and night with fluoride toothpaste. Floss one time each day. Exercise for at  least 30 minutes 5 or more days each week. Do not use any products that contain nicotine or tobacco. These products include cigarettes, chewing tobacco, and vaping devices, such as e-cigarettes. If you need help quitting, ask your health care provider. Do not use drugs. If you are sexually active, practice safe sex. Use a condom or other form of protection to prevent STIs. Take aspirin only as told by your health care provider. Make sure that you understand how much to take and what form to take. Work with your health care provider to find out whether it is safe and beneficial for you to take aspirin daily. Ask your health care provider if you need to take a cholesterol-lowering medicine (statin). Find healthy ways to manage stress, such as: Meditation, yoga, or listening to music. Journaling. Talking to a trusted person. Spending time with friends and family. Safety Always wear your seat belt while driving or riding in a vehicle. Do not drive: If you have been drinking alcohol. Do not ride with someone who has been drinking. When you are tired or distracted. While texting. If you have been using any mind-altering substances or drugs. Wear a helmet and other protective equipment during sports activities. If you have firearms in your house, make sure you follow all gun safety procedures. Minimize exposure to UV radiation to reduce  your risk of skin cancer. What's next? Visit your health care provider once a year for an annual wellness visit. Ask your health care provider how often you should have your eyes and teeth checked. Stay up to date on all vaccines. This information is not intended to replace advice given to you by your health care provider. Make sure you discuss any questions you have with your health care provider. Document Revised: 09/15/2020 Document Reviewed: 09/15/2020 Elsevier Patient Education  2024 [employer]. Understanding Your Risk for Falls Millions of people have  serious injuries from falls each year. It is important to understand your risk of falling. Talk with your health care provider about your risk and what you can do to lower it. If you do have a serious fall, make sure to tell your provider. Falling once raises your risk of falling again. How can falls affect me? Serious injuries from falls are common. These include: Broken bones, such as hip fractures. Head injuries, such as traumatic brain injuries (TBI) or concussions. A fear of falling can cause you to avoid activities and stay at home. This can make your muscles weaker and raise your risk for a fall. What can increase my risk? There are a number of risk factors that increase your risk for falling. The more risk factors you have, the higher your risk of falling. Serious injuries from a fall happen most often to people who are older than 77 years old. Teenagers and young adults ages 88-29 are also at higher risk. Common risk factors include: Weakness in the lower body. Being generally weak or confused due to long-term (chronic) illness. Dizziness or balance problems. Poor vision. Medicines that cause dizziness or drowsiness. These may include: Medicines for your blood pressure, heart, anxiety, insomnia, or swelling (edema). Pain medicines. Muscle relaxants. Other risk factors include: Drinking alcohol. Having had a fall in the past. Having foot pain or wearing improper footwear. Working at a dangerous job. Having any of the following in your home: Tripping hazards, such as floor clutter or loose rugs. Poor lighting. Pets. Having dementia or memory loss. What actions can I take to lower my risk of falling?     Physical activity Stay physically fit. Do strength and balance exercises. Consider taking a regular class to build strength and balance. Yoga and tai chi are good options. Vision Have your eyes checked every year and your prescription for glasses or contacts updated as  needed. Shoes and walking aids Wear non-skid shoes. Wear shoes that have rubber soles and low heels. Do not wear high heels. Do not walk around the house in socks or slippers. Use a cane or walker as told by your provider. Home safety Attach secure railings on both sides of your stairs. Install grab bars for your bathtub, shower, and toilet. Use a non-skid mat in your bathtub or shower. Attach bath mats securely with double-sided, non-slip rug tape. Use good lighting in all rooms. Keep a flashlight near your bed. Make sure there is a clear path from your bed to the bathroom. Use night-lights. Do not use throw rugs. Make sure all carpeting is taped or tacked down securely. Remove all clutter from walkways and stairways, including extension cords. Repair uneven or broken steps and floors. Avoid walking on icy or slippery surfaces. Walk on the grass instead of on icy or slick sidewalks. Use ice melter to get rid of ice on walkways in the winter. Use a cordless phone. Questions to ask your health care provider  Can you help me check my risk for a fall? Do any of my medicines make me more likely to fall? Should I take a vitamin D supplement? What exercises can I do to improve my strength and balance? Should I make an appointment to have my vision checked? Do I need a bone density test to check for weak bones (osteoporosis)? Would it help to use a cane or a walker? Where to find more information Centers for Disease Control and Prevention, [employer]: TonerPromos.no Community-Based Fall Prevention Programs: TonerPromos.no [employer] on Aging: BaseRingTones.pl Contact a health care provider if: You fall at home. You are afraid of falling at home. You feel weak, drowsy, or dizzy. This information is not intended to replace advice given to you by your health care provider. Make sure you discuss any questions you have with your health care provider. Document Revised: 11/21/2021 Document Reviewed:  11/21/2021 Elsevier Patient Education  2024 [employer].

## 2022-12-19 NOTE — Progress Notes (Signed)
 Because this visit was a virtual/telehealth visit,  certain criteria was not obtained, such a blood pressure, CBG if applicable, and timed get up and go. Any medications not marked as "taking" were not mentioned during the medication reconciliation part of the visit. Any vitals not documented were not able to be obtained due to this being a telehealth visit or patient was unable to self-report a recent blood pressure reading due to a lack of equipment at home via telehealth. Vitals that have been documented are verbally provided by the patient.   Subjective:   Shane Reeves is a 77 y.o. male who presents for Medicare Annual/Subsequent preventive examination.  Visit Complete: Virtual  I connected with  Molli Barrows on 12/19/22 by a audio enabled telemedicine application and verified that I am speaking with the correct person using two identifiers.  Patient Location: Home  Provider Location: Home Office  I discussed the limitations of evaluation and management by telemedicine. The patient expressed understanding and agreed to proceed.  Patient Medicare AWV questionnaire was completed by the patient on n/a; I have confirmed that all information answered by patient is correct and no changes since this date.  Cardiac Risk Factors include: advanced age (>41men, >72 women);dyslipidemia;hypertension;male gender;sedentary lifestyle     Objective:    Today's Vitals   12/19/22 0925  Weight: 155 lb (70.3 kg)  Height: 5\' 9"  (1.753 m)  PainSc: 4    Body mass index is 22.89 kg/m.     12/19/2022    9:38 AM 05/04/2021    3:40 PM  Advanced Directives  Does Patient Have a Medical Advance Directive? No No  Would patient like information on creating a medical advance directive? No - Patient declined No - Patient declined    Current Medications (verified) Outpatient Encounter Medications as of 12/19/2022  Medication Sig   albuterol (ACCUNEB) 1.25 MG/3ML nebulizer solution Take 1 ampule by  nebulization every 6 (six) hours as needed for wheezing.   albuterol (VENTOLIN HFA) 108 (90 Base) MCG/ACT inhaler Inhale 1-2 puffs into the lungs every 6 (six) hours as needed for wheezing or shortness of breath.   Cholecalciferol (VITAMIN D3) 1000 units CAPS Take 1 capsule by mouth daily.   ferrous sulfate 325 (65 FE) MG tablet Take 1 tablet by mouth 2 (two) times daily.   furosemide (LASIX) 40 MG tablet Take 1 tablet (40 mg total) by mouth daily.   mometasone (NASONEX) 50 MCG/ACT nasal spray PLACE 2 SPRAYS INTO THE NOSE DAILY.   omeprazole (PRILOSEC) 20 MG capsule Take 1 capsule (20 mg total) by mouth daily.   pravastatin (PRAVACHOL) 40 MG tablet Take 1 tablet (40 mg total) by mouth daily.   triamcinolone cream (KENALOG) 0.1 % Apply 1 Application topically 2 (two) times daily.   No facility-administered encounter medications on file as of 12/19/2022.    Allergies (verified) Doxycycline, Penicillins, and Metronidazole   History: Past Medical History:  Diagnosis Date   Hypertension    Renal disorder    Past Surgical History:  Procedure Laterality Date   BLADDER REMOVAL     EYE SURGERY     KIDNEY STONE SURGERY     History reviewed. No pertinent family history. Social History   Socioeconomic History   Marital status: Married    Spouse name: bettie   Number of children: 3   Years of education: 12   Highest education level: Not on file  Occupational History   Not on file  Tobacco Use   Smoking  status: Former    Current packs/day: 0.00    Average packs/day: 2.5 packs/day for 45.0 years (112.5 ttl pk-yrs)    Types: Cigarettes    Start date: 88    Quit date: 2006    Years since quitting: 18.7    Passive exposure: Past   Smokeless tobacco: Never  Vaping Use   Vaping status: Never Used  Substance and Sexual Activity   Alcohol use: Yes    Alcohol/week: 1.0 standard drink of alcohol    Types: 1 Cans of beer per week    Comment: occasionally   Drug use: No   Sexual  activity: Yes  Other Topics Concern   Not on file  Social History Narrative   Not on file   Social Determinants of Health   Financial Resource Strain: Low Risk  (12/19/2022)   Overall Financial Resource Strain (CARDIA)    Difficulty of Paying Living Expenses: Not hard at all  Food Insecurity: No Food Insecurity (12/19/2022)   Hunger Vital Sign    Worried About Running Out of Food in the Last Year: Never true    Ran Out of Food in the Last Year: Never true  Transportation Needs: No Transportation Needs (12/19/2022)   PRAPARE - Administrator, Civil Service (Medical): No    Lack of Transportation (Non-Medical): No  Physical Activity: Inactive (12/19/2022)   Exercise Vital Sign    Days of Exercise per Week: 0 days    Minutes of Exercise per Session: 0 min  Stress: No Stress Concern Present (12/19/2022)   [employer] of Occupational Health - Occupational Stress Questionnaire    Feeling of Stress : Not at all  Social Connections: Moderately Integrated (12/19/2022)   Social Connection and Isolation Panel [NHANES]    Frequency of Communication with Friends and Family: More than three times a week    Frequency of Social Gatherings with Friends and Family: More than three times a week    Attends Religious Services: More than 4 times per year    Active Member of [employer] or Organizations: No    Attends Engineer, structural: Never    Marital Status: Married    Tobacco Counseling Counseling given: Yes   Clinical Intake:  Pre-visit preparation completed: Yes  Pain : 0-10 Pain Score: 4  Pain Type: Acute pain Pain Location: Shoulder (patient stated he has shoulder pain and had an injection yesterday) Pain Orientation: Left Pain Descriptors / Indicators: Aching Pain Onset: In the past 7 days     BMI - recorded: 22.89 Nutritional Status: BMI of 19-24  Normal Nutritional Risks: None Diabetes: No  How often do you need to have someone help you when you read  instructions, pamphlets, or other written materials from your doctor or pharmacy?: 1 - Never  Interpreter Needed?: No  Information entered by ::  Lakea Mittelman, CMA   Activities of Daily Living    12/19/2022    9:38 AM  In your present state of health, do you have any difficulty performing the following activities:  Hearing? 1  Comment declines referral for hearing test  Vision? 0  Difficulty concentrating or making decisions? 0  Walking or climbing stairs? 0  Dressing or bathing? 0  Doing errands, shopping? 0  Preparing Food and eating ? N  Using the Toilet? N  In the past six months, have you accidently leaked urine? N  Do you have problems with loss of bowel control? N  Managing your Medications? N  Managing  your Finances? N  Housekeeping or managing your Housekeeping? N    Patient Care Team: Anabel Halon, MD as PCP - General (Internal Medicine) Arsenio Loader, [facility]  Indicate any recent Medical Services you may have received from other than [facility] providers in the past year (date may be approximate).     Assessment:   This is a routine wellness examination for Sok.  Hearing/Vision screen Hearing Screening - Comments:: Patient states he has difficulty hearing but declines referral for hearing test today  Vision Screening - Comments:: Wears rx glasses - up to date [facility]    Goals Addressed             This Visit's Progress    Patient Stated       Remain as active and healthy as possible        Depression Screen    12/19/2022    9:40 AM 11/23/2022    1:42 PM 07/27/2022    1:46 PM 04/05/2022    2:28 PM 12/07/2021    2:00 PM 08/02/2021    2:33 PM 05/04/2021    3:32 PM  PHQ 2/9 Scores  PHQ - 2 Score 0 0 0 0 0 0 0  PHQ- 9 Score 0 0         Fall Risk    12/19/2022    9:39 AM 11/23/2022    1:42 PM 07/27/2022    1:45 PM 04/05/2022    2:28 PM 12/07/2021    2:00 PM  Fall Risk   Falls in the  past year? 0 0 0 0 0  Number falls in past yr: 0  0 0 0  Injury with Fall? 0  0 0 0  Risk for fall due to : No Fall Risks;Impaired balance/gait    No Fall Risks  Follow up Falls prevention discussed;Education provided    Falls evaluation completed    MEDICARE RISK AT HOME: Medicare Risk at Home Any stairs in or around the home?: No If so, are there any without handrails?: No Home free of loose throw rugs in walkways, pet beds, electrical cords, etc?: Yes Adequate lighting in your home to reduce risk of falls?: Yes Life alert?: No Use of a cane, walker or w/c?: Yes Grab bars in the bathroom?: No Shower chair or bench in shower?: No Elevated toilet seat or a handicapped toilet?: No  TIMED UP AND GO:  Was the test performed?  No    Cognitive Function:        12/19/2022    9:40 AM 05/04/2021    3:44 PM  6CIT Screen  What Year? 0 points 0 points  What month? 0 points 0 points  What time? 0 points 0 points  Count back from 20 0 points 0 points  Months in reverse 0 points 0 points  Repeat phrase 0 points 10 points  Total Score 0 points 10 points    Immunizations Immunization History  Administered Date(s) Administered   Fluad Quad(high Dose 65+) 01/29/2020, 02/01/2021, 12/07/2021   Influenza Split 02/21/2018, 02/01/2021   Influenza, High Dose Seasonal PF 09/06/2017   Influenza, Seasonal, Injecte, Preservative Fre 02/03/2013, 02/19/2014   Influenza-Unspecified 01/08/2012   Moderna Sars-Covid-2 Vaccination 06/16/2019, 07/17/2019, 03/05/2020   PNEUMOCOCCAL CONJUGATE-20 04/05/2022   Pneumococcal Conjugate PCV 7 04/05/2022   Unspecified SARS-COV-2 Vaccination 02/18/2020    TDAP status: Due, Education has been provided regarding the importance of this vaccine. Advised  may receive this vaccine at local pharmacy or Health Dept. Aware to provide a copy of the vaccination record if obtained from local pharmacy or Health Dept. Verbalized acceptance and understanding.  Flu Vaccine  status: Due, Education has been provided regarding the importance of this vaccine. Advised may receive this vaccine at local pharmacy or Health Dept. Aware to provide a copy of the vaccination record if obtained from local pharmacy or Health Dept. Verbalized acceptance and understanding.  Pneumococcal vaccine status: Up to date  Covid-19 vaccine status: Information provided on how to obtain vaccines.   Qualifies for Shingles Vaccine? Yes   Zostavax completed No   Shingrix Completed?: No.    Education has been provided regarding the importance of this vaccine. Patient has been advised to call insurance company to determine out of pocket expense if they have not yet received this vaccine. Advised may also receive vaccine at local pharmacy or Health Dept. Verbalized acceptance and understanding.  Screening Tests Health Maintenance  Topic Date Due   DTaP/Tdap/Td (1 - Tdap) Never done   Zoster Vaccines- Shingrix (1 of 2) Never done   Medicare Annual Wellness (AWV)  05/04/2022   INFLUENZA VACCINE  11/02/2022   COVID-19 Vaccine (5 - 2023-24 season) 12/03/2022   Pneumonia Vaccine 74+ Years old  Completed   Hepatitis C Screening  Completed   HPV VACCINES  Aged Out    Health Maintenance  Health Maintenance Due  Topic Date Due   DTaP/Tdap/Td (1 - Tdap) Never done   Zoster Vaccines- Shingrix (1 of 2) Never done   Medicare Annual Wellness (AWV)  05/04/2022   INFLUENZA VACCINE  11/02/2022   COVID-19 Vaccine (5 - 2023-24 season) 12/03/2022    Colorectal Cancer Screening: Patient declined colorectal cancer screening   Lung Cancer Screening: (Low Dose CT Chest recommended if Age 22-80 years, 20 pack-year currently smoking OR have quit w/in 15years.) does not qualify.   Additional Screening:  Hepatitis C Screening: does not qualify; Completed 04/05/2022  Vision Screening: Recommended annual ophthalmology exams for early detection of glaucoma and other disorders of the eye. Is the patient up to  date with their annual eye exam?  Yes  Who is the provider or what is the name of the office in which the patient attends annual eye exams? [facility] If pt is not established with a provider, would they like to be referred to a provider to establish care? No .   Dental Screening: Recommended annual dental exams for proper oral hygiene  Diabetic Foot Exam: n/a  Community Resource Referral / Chronic Care Management: CRR required this visit?  No   CCM required this visit?  No     Plan:     I have personally reviewed and noted the following in the patient's chart:   Medical and social history Use of alcohol, tobacco or illicit drugs  Current medications and supplements including opioid prescriptions. Patient is not currently taking opioid prescriptions. Functional ability and status Nutritional status Physical activity Advanced directives List of other physicians Hospitalizations, surgeries, and ER visits in previous 12 months Vitals Screenings to include cognitive, depression, and falls Referrals and appointments  In addition, I have reviewed and discussed with patient certain preventive protocols, quality metrics, and best practice recommendations. A written personalized care plan for preventive services as well as general preventive health recommendations were provided to patient.     Jordan Hawks Ninoshka Wainwright, CMA   12/19/2022   After Visit Summary: (Mail) Due to this  being a telephonic visit, the after visit summary with patients personalized plan was offered to patient via mail   Nurse Notes:

## 2023-04-05 ENCOUNTER — Other Ambulatory Visit: Payer: Self-pay | Admitting: Internal Medicine

## 2023-04-05 DIAGNOSIS — E782 Mixed hyperlipidemia: Secondary | ICD-10-CM

## 2023-04-26 ENCOUNTER — Encounter: Payer: Medicare Other | Admitting: Internal Medicine

## 2023-05-04 ENCOUNTER — Telehealth: Payer: Self-pay

## 2023-05-04 NOTE — Transitions of Care (Post Inpatient/ED Visit) (Signed)
05/04/2023  Name: Shane Reeves MRN: [id number] DOB: 01/18/1946  Today's TOC FU Call Status: Today's TOC FU Call Status:: Successful TOC FU Call Completed TOC FU Call Complete Date: 05/04/23 Patient's Name and Date of Birth confirmed.  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 Reports itching while at the hospital) Any questions or concerns?: Yes Patient Questions/Concerns:: severe itching,  creams at the hospital without relief  Items Reviewed: Did you receive and understand the discharge instructions provided?: Yes Medications obtained,verified, and reconciled?: Yes (Medications Reviewed) Any new allergies since your discharge?: No (unknown reason for itching) Dietary orders reviewed?: NA Do you have support at home?: Yes Name of Support/Comfort Primary Source: family  Medications Reviewed Today: Medications Reviewed Today     Reviewed by Earlie Server, RN (Registered Nurse) on 05/04/23 at 1332  Med List Status: <None>   Medication Order Taking? Sig Documenting Provider Last Dose Status Informant  albuterol (ACCUNEB) 1.25 MG/3ML nebulizer solution [id number] Yes Take 1 ampule by nebulization every 6 (six) hours as needed for wheezing. [provider] Taking Active   albuterol (VENTOLIN HFA) 108 (90 Base) MCG/ACT inhaler [id number] Yes Inhale 1-2 puffs into the lungs every 6 (six) hours as needed for wheezing or shortness of breath. Anabel Halon, MD Taking Active   amLODipine (NORVASC) 5 MG tablet [id number] Yes Take 5 mg by mouth daily. For 7 days [provider] Taking  Active   Cholecalciferol (VITAMIN D3) 1000 units CAPS [id number] Yes Take 1 capsule by mouth daily. [provider] Taking Active Multiple Informants  ciprofloxacin (CIPRO) 250 MG tablet [id number] Yes Take 250 mg by mouth daily. For 5 days [provider] Taking Active   doxycycline (DORYX) 100 MG EC tablet [id number] Yes Take 100 mg by mouth 2 (two) times daily. For 6 days [provider] Taking Active   ferrous sulfate 325 (65 FE) MG tablet [id number] Yes Take 1 tablet by mouth 2 (two) times daily. [provider] Taking Active Multiple Informants  fluconazole (DIFLUCAN) 100 MG tablet [id number] Yes Take 100 mg by mouth daily. For 6 days [provider] Taking Active   furosemide (LASIX) 40 MG tablet [id number] Yes Take 1 tablet (40 mg total) by mouth daily. Anabel Halon, MD Taking Active   mometasone (NASONEX) 50 MCG/ACT nasal spray [id number] Yes PLACE 2 SPRAYS INTO THE NOSE DAILY. Anabel Halon, MD Taking Active   multivitamin (RENA-VIT) TABS tablet [id number] Yes Take 1 tablet by mouth daily. [provider] Taking Active   omeprazole (PRILOSEC) 20 MG capsule [id number] No Take 1 capsule (20 mg total) by mouth daily. Anabel Halon, MD Unknown Active   oxycodone (OXY-IR) 5 MG capsule [id number] Yes Take 5 mg by mouth every 4 (four) hours as needed for pain (for 3 days). [provider] Taking Active   pravastatin (PRAVACHOL) 40 MG tablet [id number] Yes TAKE ONE TABLET BY MOUTH EVERY DAY Anabel Halon, MD Taking Active   triamcinolone cream (KENALOG) 0.1 % [id number] Yes Apply 1 Application topically 2 (two) times daily. Anabel Halon, MD Taking Active             Home Care and Equipment/Supplies: Were Home Health  Services Ordered?: NA Any new equipment or medical supplies ordered?: NA  Functional Questionnaire: Do you need assistance with bathing/showering or dressing?: Yes (family to assist) Do you need assistance with  meal preparation?: Yes (family to assist) Do you need assistance with eating?: No Do you have difficulty maintaining continence: No Do you need assistance with getting out of bed/getting out of a chair/moving?: No Do you have difficulty managing or taking your medications?: No  Follow up appointments reviewed: PCP Follow-up appointment confirmed?: Yes Date of PCP follow-up appointment?: 05/09/23 Follow-up Provider: PCP Specialist Hospital Follow-up appointment confirmed?: No Do you need transportation to your follow-up appointment?: No Do you understand care options if your condition(s) worsen?: Yes-patient verbalized understanding  SDOH Interventions Today    Flowsheet Row Most Recent Value  SDOH Interventions   Food Insecurity Interventions Intervention Not Indicated  Housing Interventions Intervention Not Indicated  Transportation Interventions Intervention Not Indicated  Utilities Interventions Intervention Not Indicated      Interventions Today    Flowsheet Row Most Recent Value  Chronic Disease   Chronic disease during today's visit Chronic Kidney Disease/End Stage Renal Disease (ESRD)  General Interventions   General Interventions Discussed/Reviewed General Interventions Discussed, General Interventions Reviewed, Doctor Visits, Communication with  [message sent to PCP about itching]  Doctor Visits Discussed/Reviewed Doctor Visits Discussed  Communication with PCP/Specialists  Education Interventions   Education Provided Provided Education  [encoruaged patient to speak with nurse and MD at the dialysis center about itching.]  Nutrition Interventions   Nutrition Discussed/Reviewed Nutrition Discussed  Pharmacy Interventions   Pharmacy Dicussed/Reviewed Medications and their functions  [reviewed medications from discharge instructions.]       TOC Interventions Today    Flowsheet Row Most Recent Value  TOC Interventions   TOC Interventions Discussed/Reviewed TOC  Interventions Discussed, TOC Interventions Reviewed, Post discharge activity limitations per provider, S/S of infection       Spoke with patient first thing this am, he was leaving for dialysis. Called patient back and he reports that he is still in dialysis. Stays that the machine is loud, he is tied to a machine, he is losing a day of his life, it is torture.  Reports that if he can't get a kidney that he is ready.  Reports severe itching during hospitalization and at home.  Reports that he has spoke to dialysis nurse about it and the MD at the hospital.    Reviewed with patient his medications and follow up appointment.  This note sent to PCP about concern for patient itching and new antibiotics. Patient was placed on doxycycline which is listed on his allergy list to cause nausea.    Offered patient 30 day TOC program and he declined. Message sent to PCP  Lonia Chimera, RN, BSN, CEN Population Health- Transition of Care Team.  [facility] [phone]

## 2023-05-09 ENCOUNTER — Encounter: Payer: Self-pay | Admitting: Internal Medicine

## 2023-05-09 ENCOUNTER — Ambulatory Visit (INDEPENDENT_AMBULATORY_CARE_PROVIDER_SITE_OTHER): Payer: Medicare Other | Admitting: Internal Medicine

## 2023-05-09 VITALS — BP 130/75 | HR 85 | Ht 68.0 in | Wt 163.6 lb

## 2023-05-09 DIAGNOSIS — R911 Solitary pulmonary nodule: Secondary | ICD-10-CM | POA: Insufficient documentation

## 2023-05-09 DIAGNOSIS — I10 Essential (primary) hypertension: Secondary | ICD-10-CM | POA: Diagnosis not present

## 2023-05-09 DIAGNOSIS — Z0001 Encounter for general adult medical examination with abnormal findings: Secondary | ICD-10-CM

## 2023-05-09 DIAGNOSIS — N2581 Secondary hyperparathyroidism of renal origin: Secondary | ICD-10-CM

## 2023-05-09 DIAGNOSIS — K219 Gastro-esophageal reflux disease without esophagitis: Secondary | ICD-10-CM

## 2023-05-09 DIAGNOSIS — E782 Mixed hyperlipidemia: Secondary | ICD-10-CM

## 2023-05-09 DIAGNOSIS — Z936 Other artificial openings of urinary tract status: Secondary | ICD-10-CM

## 2023-05-09 DIAGNOSIS — N186 End stage renal disease: Secondary | ICD-10-CM

## 2023-05-09 DIAGNOSIS — Z992 Dependence on renal dialysis: Secondary | ICD-10-CM

## 2023-05-09 DIAGNOSIS — J449 Chronic obstructive pulmonary disease, unspecified: Secondary | ICD-10-CM

## 2023-05-09 MED ORDER — OMEPRAZOLE 20 MG PO CPDR: 20.0000 mg | DELAYED_RELEASE_CAPSULE | Freq: Every day | ORAL | 3 refills | Status: AC

## 2023-05-09 MED ORDER — PRAVASTATIN SODIUM 40 MG PO TABS: 40.0000 mg | ORAL_TABLET | Freq: Every day | ORAL | 3 refills | Status: AC

## 2023-05-09 MED ORDER — AMLODIPINE BESYLATE 10 MG PO TABS: ORAL_TABLET | ORAL | 3 refills | Status: AC

## 2023-05-09 NOTE — Assessment & Plan Note (Signed)
Takes Omeprazole, well-controlled °

## 2023-05-09 NOTE — Assessment & Plan Note (Signed)
Has urostomy in place with ileal conduit, has h/o metastatic bladder cancer, needs to see urologist

## 2023-05-09 NOTE — Progress Notes (Signed)
 Established Patient Office Visit  Subjective:  Patient ID: Shane Reeves, male    DOB: 1945-09-17  Age: 78 y.o. MRN: [id number]  CC:  Chief Complaint  Patient presents with   Annual Exam    Cpe visit    HPI Shane Reeves is a 78 y.o. male with past medical history of HTN, COPD, GERD, ESRD, malignant bladder cancer s/p resection and ileostomy placement and chronic pain syndrome who presents for annual physical.  He was recently admitted at [facility] for peritoneal dialysis catheter dysfunction.  He had removal of PD catheter due to concern for peritonitis.  He is currently not happy to switch back to HD and repeatedly says that he would rather die.  No suicidal ideation currently.  ESRD: Has been following up with Nephrology.  He has started HD on MWF through right subclavian HD catheter after recent hospitalization.  He reports having dizziness and presyncopal episodes during dialysis.  He has hypotensive episodes at the end of HD sessions and was advised to stop amlodipine  10 mg daily for HTN.  He still takes furosemide  40 mg BID currently.  He also has h/o frequent nephrolithiasis and UTIs in the past.  He was referred to urology for it, but did not follow up as he states that they can not cure me.  COPD: He reports chronic cough, but with recent worsening of cough with clear expectoration for the last 2 weeks.  Denies any fever or chills.  He also reports dyspnea with minimal exertion.  He has albuterol  inhaler, but rarely uses it.  Urostomy: Has urostomy tube in place from surgery for bladder cancer according to him. He has seen [facility] for it now. Denies any fever or chills.  He has history of recurrent UTIs in the past.   Past Medical History:  Diagnosis Date   Hypertension    Renal disorder     Past Surgical History:  Procedure Laterality Date   BLADDER REMOVAL     EYE SURGERY     KIDNEY STONE SURGERY      History reviewed. No pertinent family  history.  Social History   Socioeconomic History   Marital status: Married    Spouse name: bettie   Number of children: 3   Years of education: 12   Highest education level: Not on file  Occupational History   Not on file  Tobacco Use   Smoking status: Former    Current packs/day: 0.00    Average packs/day: 2.5 packs/day for 45.0 years (112.5 ttl pk-yrs)    Types: Cigarettes    Start date: 21    Quit date: 2006    Years since quitting: 19.1    Passive exposure: Past   Smokeless tobacco: Never  Vaping Use   Vaping status: Never Used  Substance and Sexual Activity   Alcohol use: Yes    Alcohol/week: 1.0 standard drink of alcohol    Types: 1 Cans of beer per week    Comment: occasionally   Drug use: No   Sexual activity: Yes  Other Topics Concern   Not on file  Social History Narrative   Not on file   Social Drivers of Health   Financial Resource Strain: Patient Declined (04/27/2023)   [facility]   Overall Financial Resource Strain (CARDIA)    Difficulty of Paying Living Expenses: Patient declined  Food Insecurity: No Food Insecurity (05/04/2023)   Hunger Vital Sign    Worried About  Running Out of Food in the Last Year: Never true    Ran Out of Food in the Last Year: Never true  Transportation Needs: No Transportation Needs (05/04/2023)   PRAPARE - Administrator, Civil Service (Medical): No    Lack of Transportation (Non-Medical): No  Physical Activity: Inactive (12/19/2022)   Exercise Vital Sign    Days of Exercise per Week: 0 days    Minutes of Exercise per Session: 0 min  Stress: No Stress Concern Present (12/19/2022)   [employer] of Occupational Health - Occupational Stress Questionnaire    Feeling of Stress : Not at all  Social Connections: Moderately Integrated (12/19/2022)   Social Connection and Isolation Panel [NHANES]    Frequency of Communication with Friends and Family: More than three times a week     Frequency of Social Gatherings with Friends and Family: More than three times a week    Attends Religious Services: More than 4 times per year    Active Member of [employer] or Organizations: No    Attends Banker Meetings: Never    Marital Status: Married  Catering Manager Violence: Not At Risk (05/04/2023)   Humiliation, Afraid, Rape, and Kick questionnaire    Fear of Current or Ex-Partner: No    Emotionally Abused: No    Physically Abused: No    Sexually Abused: No    Outpatient Medications Prior to Visit  Medication Sig Dispense Refill   Cholecalciferol (VITAMIN D3) 1000 units CAPS Take 1 capsule by mouth daily.     ferrous sulfate 325 (65 FE) MG tablet Take 1 tablet by mouth 2 (two) times daily.     furosemide  (LASIX ) 40 MG tablet Take 1 tablet (40 mg total) by mouth daily. 90 tablet 0   mometasone  (NASONEX ) 50 MCG/ACT nasal spray PLACE 2 SPRAYS INTO THE NOSE DAILY. 51 each 1   multivitamin (RENA-VIT) TABS tablet Take 1 tablet by mouth daily.     oxycodone  (OXY-IR) 5 MG capsule Take 5 mg by mouth every 4 (four) hours as needed for pain (for 3 days).     sodium bicarbonate  650 MG tablet Take 650 mg by mouth 4 (four) times daily.     albuterol  (ACCUNEB ) 1.25 MG/3ML nebulizer solution Take 1 ampule by nebulization every 6 (six) hours as needed for wheezing.     albuterol  (VENTOLIN  HFA) 108 (90 Base) MCG/ACT inhaler Inhale 1-2 puffs into the lungs every 6 (six) hours as needed for wheezing or shortness of breath. 18 g 1   amLODipine  (NORVASC ) 10 MG tablet Take 10 mg by mouth daily.     amLODipine  (NORVASC ) 5 MG tablet Take 5 mg by mouth daily. For 7 days     ciprofloxacin  (CIPRO ) 250 MG tablet Take 250 mg by mouth daily. For 5 days     ciprofloxacin  (CIPRO ) 500 MG tablet Take 500 mg by mouth 2 (two) times daily.     doxycycline (ADOXA) 100 MG tablet Take 100 mg by mouth 2 (two) times daily.     doxycycline (DORYX) 100 MG EC tablet Take 100 mg by mouth 2 (two) times daily. For 6  days     fluconazole (DIFLUCAN) 100 MG tablet Take 100 mg by mouth daily. For 6 days     omeprazole  (PRILOSEC) 20 MG capsule Take 1 capsule (20 mg total) by mouth daily. 90 capsule 3   pravastatin  (PRAVACHOL ) 40 MG tablet TAKE ONE TABLET BY MOUTH EVERY DAY 90 tablet 3  triamcinolone  cream (KENALOG ) 0.1 % Apply 1 Application topically 2 (two) times daily. (Patient not taking: Reported on 05/09/2023) 30 g 1   No facility-administered medications prior to visit.    Allergies  Allergen Reactions   Doxycycline Nausea Only   Penicillins Anaphylaxis    .Has patient had a PCN reaction causing immediate rash, facial/tongue/throat swelling, SOB or lightheadedness with hypotension: Yes Has patient had a PCN reaction causing severe rash involving mucus membranes or skin necrosis: No Has patient had a PCN reaction that required hospitalization: Yes Has patient had a PCN reaction occurring within the last 10 years No If all of the above answers are NO, then may proceed with Cephalosporin use.    Metronidazole Hives    ROS Review of Systems  Constitutional:  Negative for chills and fever.  HENT:  Negative for ear pain, postnasal drip and sore throat.   Eyes:  Negative for pain and discharge.  Respiratory:  Positive for cough. Negative for shortness of breath.   Cardiovascular:  Negative for chest pain and palpitations.  Gastrointestinal:  Negative for diarrhea, nausea and vomiting.  Endocrine: Negative for polydipsia and polyuria.  Genitourinary:  Negative for dysuria and hematuria.  Musculoskeletal:  Positive for arthralgias and back pain. Negative for neck pain and neck stiffness.  Skin:  Negative for rash.  Neurological:  Negative for numbness and headaches.  Psychiatric/Behavioral:  Negative for agitation and behavioral problems. The patient is nervous/anxious.       Objective:    Physical Exam Vitals reviewed.  Constitutional:      General: He is not in acute distress.     Appearance: He is not diaphoretic.  HENT:     Head: Normocephalic and atraumatic.     Ears:     Comments: Has middle ear tube in place    Nose: Nose normal.     Mouth/Throat:     Mouth: Mucous membranes are moist.  Eyes:     General: No scleral icterus.    Extraocular Movements: Extraocular movements intact.  Cardiovascular:     Rate and Rhythm: Normal rate and regular rhythm.     Pulses: Normal pulses.     Heart sounds: Normal heart sounds. No murmur heard. Pulmonary:     Breath sounds: Normal breath sounds. No wheezing or rales.  Abdominal:     Palpations: Abdomen is soft.     Tenderness: There is no abdominal tenderness.     Comments: Urostomy tube in place  Musculoskeletal:     Cervical back: Neck supple. No tenderness.     Right lower leg: No edema.     Left lower leg: No edema.  Skin:    General: Skin is warm.     Findings: No rash.  Neurological:     General: No focal deficit present.     Mental Status: He is alert and oriented to person, place, and time.     Cranial Nerves: No cranial nerve deficit.     Sensory: No sensory deficit.     Motor: No weakness.  Psychiatric:        Mood and Affect: Mood normal.        Behavior: Behavior normal.     BP 130/75   Pulse 85   Ht 5' 8 (1.727 m)   Wt 163 lb 9.6 oz (74.2 kg)   SpO2 97%   BMI 24.88 kg/m  Wt Readings from Last 3 Encounters:  05/09/23 163 lb 9.6 oz (74.2 kg)  12/19/22 155  lb (70.3 kg)  11/23/22 161 lb (73 kg)    Lab Results  Component Value Date   TSH 1.040 04/05/2022   Lab Results  Component Value Date   WBC 7.6 04/05/2022   HGB 11.7 (L) 04/05/2022   HCT 37.0 (L) 04/05/2022   MCV 92 04/05/2022   PLT 226 04/05/2022   Lab Results  Component Value Date   NA 139 04/05/2022   K 5.3 (H) 04/05/2022   CO2 13 (L) 04/05/2022   GLUCOSE 94 04/05/2022   BUN 60 (H) 04/05/2022   CREATININE 4.96 (H) 04/05/2022   BILITOT 0.4 04/05/2022   ALKPHOS 80 04/05/2022   AST 13 04/05/2022   ALT 7 04/05/2022    PROT 7.6 04/05/2022   ALBUMIN  4.2 04/05/2022   CALCIUM 8.8 04/05/2022   ANIONGAP 8 10/03/2017   EGFR 11 (L) 04/05/2022   Lab Results  Component Value Date   CHOL 114 04/05/2022   Lab Results  Component Value Date   HDL 30 (L) 04/05/2022   Lab Results  Component Value Date   LDLCALC 60 04/05/2022   Lab Results  Component Value Date   TRIG 134 04/05/2022   Lab Results  Component Value Date   CHOLHDL 3.8 04/05/2022   Lab Results  Component Value Date   HGBA1C 5.3 08/02/2021      Assessment & Plan:   Problem List Items Addressed This Visit       Cardiovascular and Mediastinum   Essential hypertension   BP Readings from Last 1 Encounters:  05/09/23 130/75   Well-controlled, on Amlodipine  10 mg QD and Furosemide  40 mg twice daily (managed by Nephrology) Had hypotension during HD sessions, advised to take amlodipine  only on non-HD days and avoid Lasix  AM dose on HD days Counseled for compliance with the medications Advised DASH diet and moderate exercise/walking as tolerated      Relevant Medications   amLODipine  (NORVASC ) 10 MG tablet   pravastatin  (PRAVACHOL ) 40 MG tablet     Respiratory   Chronic obstructive pulmonary disease (HCC)   Has Albuterol  inhaler PRN, refilled Not on any maintenance treatment      Pulmonary nodule   Incidental finding of left lower lung nodule on CT abdomen pelvis - about 9 mm in size Repeat CT chest after 6 months        Digestive   Gastroesophageal reflux disease   Takes Omeprazole , well-controlled      Relevant Medications   sodium bicarbonate  650 MG tablet   omeprazole  (PRILOSEC) 20 MG capsule     Endocrine   Secondary hyperparathyroidism (HCC)   Due to ESRD On HD now        Genitourinary   ESRD (end stage renal disease) (HCC)   On HD MWF Last BMP in EMR noted Does follow up with Nephrology On Vitamin D , iron supplements and sodium bicarbonate  Referred him to urology as he has history of multiple  urologic procedures Strongly advised to continue follow up with Nephrology and Urology - he is planning to see Nephrology at [facility] health as he prefers to get peritoneal dialysis and later renal transplant        Other   Presence of urostomy (HCC)   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 Recent blood tests from hospital reviewed.      Dependence on renal dialysis (HCC)   ESRD, on HD MWF Has hypotensive/presyncopal episodes during HD, and advised to avoid amlodipine  on HD days He had peritoneal dialysis, but had to be removed due to concern for SBP - had dressing since hospitalization, removed today -site appears C/D/I        Meds ordered this encounter  Medications   amLODipine  (NORVASC ) 10 MG tablet    Sig: Please take 1 tablet by mouth on Tuesday, Thursday, Saturday and Sunday.    Dispense:  50 tablet    Refill:  3   omeprazole  (PRILOSEC) 20 MG capsule    Sig: Take 1 capsule (20 mg total) by mouth daily.    Dispense:  90 capsule    Refill:  3   pravastatin  (PRAVACHOL ) 40 MG tablet    Sig: Take 1 tablet (40 mg total) by mouth daily.    Dispense:  90 tablet    Refill:  3    Follow-up: Return in about 6 months (around 11/06/2023) for HTN and ESRD.    Suzzane MARLA Blanch, MD

## 2023-05-09 NOTE — Assessment & Plan Note (Addendum)
 BP Readings from Last 1 Encounters:  05/09/23 130/75   Well-controlled, on Amlodipine  10 mg QD and Furosemide  40 mg twice daily (managed by Nephrology) Had hypotension during HD sessions, advised to take amlodipine  only on non-HD days and avoid Lasix  AM dose on HD days Counseled for compliance with the medications Advised DASH diet and moderate exercise/walking as tolerated

## 2023-05-09 NOTE — Assessment & Plan Note (Signed)
 Physical exam as documented. Recent blood tests from hospital reviewed.

## 2023-05-09 NOTE — Assessment & Plan Note (Signed)
Due to ESRD On HD now

## 2023-05-09 NOTE — Assessment & Plan Note (Signed)
 Has Albuterol  inhaler PRN, refilled Not on any maintenance treatment

## 2023-05-09 NOTE — Patient Instructions (Addendum)
Please take Amlodipine only on non-dialysis days.  Please complete antibiotic as prescribed.  Please continue to take medications as prescribed.  Please continue to follow low salt diet and ambulate as tolerated.

## 2023-05-09 NOTE — Assessment & Plan Note (Signed)
On statin, refilled

## 2023-05-09 NOTE — Assessment & Plan Note (Signed)
 Incidental finding of left lower lung nodule on CT abdomen pelvis - about 9 mm in size Repeat CT chest after 6 months

## 2023-05-09 NOTE — Assessment & Plan Note (Addendum)
 On HD MWF Last BMP in EMR noted Does follow up with Nephrology On Vitamin D , iron supplements and sodium bicarbonate  Referred him to urology as he has history of multiple urologic procedures Strongly advised to continue follow up with Nephrology and Urology - he is planning to see Nephrology at [facility] health as he prefers to get peritoneal dialysis and later renal transplant

## 2023-05-09 NOTE — Assessment & Plan Note (Addendum)
 ESRD, on HD MWF Has hypotensive/presyncopal episodes during HD, and advised to avoid amlodipine  on HD days He had peritoneal dialysis, but had to be removed due to concern for SBP - had dressing since hospitalization, removed today -site appears C/D/I

## 2023-06-18 ENCOUNTER — Ambulatory Visit: Payer: Self-pay | Admitting: Internal Medicine

## 2023-06-18 NOTE — Telephone Encounter (Signed)
 Copied from CRM [id number]. Topic: Clinical - Red Word Triage >> Jun 18, 2023  1:39 PM Gildardo Pounds wrote: Red Word that prompted transfer to Nurse Triage: issues breathing at night; oxygen levels decreasing to the point patient becomes confused, anxious, unaware of surrounding and really bad cough. Victorino Dike, daughter, callback number is [phone]   Chief Complaint: Difficulty breathing  Symptoms: Breathing difficulty at night when sleeping, confusion upon waking  Frequency: Occurs at night only Disposition: [] ED /[] Urgent Care (no appt availability in office) / [] Appointment(In office/virtual)/ []  [place] [facility]/ [] Home Care/ [] Refused Recommended Disposition /[] [place] [facility]/ [x]  Follow-up with PCP Additional Notes: Patient's daughter called to report that for the last few months the patient has had some difficulty breathing while sleeping at night. She states that it has been worsening over the last 2 weeks and wanted to know if he could get an order for nighttime oxygen to see if that would help. She states she will also contact his kidney doctor to see if they have any recommendations. She states that when the patient wakes up after having difficulty breathing the Pulse Ox they use reads low until he gets up and walks, and then it goes back to a normal level. She reports that after he wakes up like this he will go and sleep in a recliner, and that he does not have the same difficulty breathing in the recliner. I advised to have him sleep in the recliner all night if it helps with his breathing, which she is agreeable with. I also advised of what symptoms would prompt an ED visit.   The office has no appointments until May. Patient's daughter has requested a call back with a response to the oxygen request or with any other advice/instructions.   Reason for Disposition  Oxygen level (e.g., pulse oximetry) 91 to 94 percent  Answer Assessment - Initial Assessment Questions 1.  RESPIRATORY STATUS: "Describe your breathing?" (e.g., wheezing, shortness of breath, unable to speak, severe coughing)      Difficulty breathing at night 2. ONSET: "When did this breathing problem begin?"      A few months, worse over the last 1-2 weeks  3. PATTERN "Does the difficult breathing come and go, or has it been constant since it started?"      Occurs at night  4. SEVERITY: "How bad is your breathing?" (e.g., mild, moderate, severe)    - MILD: No SOB at rest, mild SOB with walking, speaks normally in sentences, can lie down, no retractions, pulse < 100.    - MODERATE: SOB at rest, SOB with minimal exertion and prefers to sit, cannot lie down flat, speaks in phrases, mild retractions, audible wheezing, pulse 100-120.    - SEVERE: Very SOB at rest, speaks in single words, struggling to breathe, sitting hunched forward, retractions, pulse > 120      Only occurs when laying down and sleeping  5. RECURRENT SYMPTOM: "Have you had difficulty breathing before?" If Yes, ask: "When was the last time?" and "What happened that time?"      Ongoing for a few months  6. CARDIAC HISTORY: "Do you have any history of heart disease?" (e.g., heart attack, angina, bypass surgery, angioplasty)      No 7. LUNG HISTORY: "Do you have any history of lung disease?"  (e.g., pulmonary embolus, asthma, emphysema)     COPD 8. CAUSE: "What do you think is causing the breathing problem?"      Unsure 9. OTHER  SYMPTOMS: "Do you have any other symptoms? (e.g., dizziness, runny nose, cough, chest pain, fever)     No 10. O2 SATURATION MONITOR:  "Do you use an oxygen saturation monitor (pulse oximeter) at home?" If Yes, ask: "What is your reading (oxygen level) today?" "What is your usual oxygen saturation reading?" (e.g., 95%)       Reads "Low" when first waking up, but goes back to normal after getting up and moving  Protocols used: Breathing Difficulty-A-AH

## 2023-06-18 NOTE — Telephone Encounter (Signed)
 Pt states someone else is willing to see him at this time.

## 2023-11-06 ENCOUNTER — Ambulatory Visit: Payer: Medicare Other | Admitting: Internal Medicine
